# Patient Record
Sex: Male | Born: 1960 | Race: White | Hispanic: No | Marital: Single | Smoking: Never smoker
Health system: Southern US, Community
[De-identification: ages and names within clinical notes are randomized; demographics above are authoritative.]

## PROBLEM LIST (undated history)

## (undated) DIAGNOSIS — I719 Aortic aneurysm of unspecified site, without rupture: Secondary | ICD-10-CM

## (undated) DIAGNOSIS — Z953 Presence of xenogenic heart valve: Secondary | ICD-10-CM

## (undated) DIAGNOSIS — I10 Essential (primary) hypertension: Secondary | ICD-10-CM

## (undated) HISTORY — PX: OTHER SURGICAL HISTORY: SHX169

## (undated) HISTORY — PX: APPENDECTOMY: SHX54

## (undated) SURGERY — ECHOCARDIOGRAM, TRANSESOPHAGEAL
Anesthesia: General

---
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
## 2005-09-11 ENCOUNTER — Ambulatory Visit: Payer: Self-pay | Admitting: Cardiology

## 2006-02-11 ENCOUNTER — Ambulatory Visit: Payer: Self-pay | Admitting: Cardiology

## 2006-05-13 ENCOUNTER — Encounter: Payer: Self-pay | Admitting: Cardiology

## 2006-05-24 ENCOUNTER — Encounter: Payer: Self-pay | Admitting: Cardiology

## 2006-06-24 ENCOUNTER — Encounter: Payer: Self-pay | Admitting: Cardiology

## 2006-07-25 ENCOUNTER — Encounter: Payer: Self-pay | Admitting: Cardiology

## 2006-08-24 ENCOUNTER — Encounter: Payer: Self-pay | Admitting: Cardiology

## 2006-09-24 ENCOUNTER — Encounter: Payer: Self-pay | Admitting: Cardiology

## 2008-06-19 ENCOUNTER — Ambulatory Visit: Payer: Self-pay | Admitting: Unknown Physician Specialty

## 2013-11-04 DIAGNOSIS — Z8774 Personal history of (corrected) congenital malformations of heart and circulatory system: Secondary | ICD-10-CM

## 2013-11-04 DIAGNOSIS — Z953 Presence of xenogenic heart valve: Secondary | ICD-10-CM

## 2013-11-04 HISTORY — DX: Personal history of (corrected) congenital malformations of heart and circulatory system: Z87.74

## 2013-11-04 HISTORY — DX: Presence of xenogenic heart valve: Z95.3

## 2014-07-12 DIAGNOSIS — I351 Nonrheumatic aortic (valve) insufficiency: Secondary | ICD-10-CM | POA: Insufficient documentation

## 2014-07-12 HISTORY — DX: Nonrheumatic aortic (valve) insufficiency: I35.1

## 2017-05-15 DIAGNOSIS — R002 Palpitations: Secondary | ICD-10-CM | POA: Insufficient documentation

## 2017-05-15 HISTORY — DX: Palpitations: R00.2

## 2017-05-28 DIAGNOSIS — I493 Ventricular premature depolarization: Secondary | ICD-10-CM | POA: Insufficient documentation

## 2017-05-28 HISTORY — DX: Ventricular premature depolarization: I49.3

## 2018-01-18 ENCOUNTER — Encounter: Payer: Self-pay | Admitting: Emergency Medicine

## 2018-01-18 ENCOUNTER — Ambulatory Visit
Admission: EM | Admit: 2018-01-18 | Discharge: 2018-01-18 | Disposition: A | Discharge status: Home / Self Care | Payer: Managed Care, Other (non HMO) | Attending: Family Medicine | Admitting: Family Medicine

## 2018-01-18 ENCOUNTER — Other Ambulatory Visit: Payer: Self-pay

## 2018-01-18 DIAGNOSIS — R05 Cough: Secondary | ICD-10-CM | POA: Diagnosis not present

## 2018-01-18 DIAGNOSIS — J111 Influenza due to unidentified influenza virus with other respiratory manifestations: Secondary | ICD-10-CM

## 2018-01-18 DIAGNOSIS — R69 Illness, unspecified: Secondary | ICD-10-CM | POA: Diagnosis not present

## 2018-01-18 DIAGNOSIS — R509 Fever, unspecified: Secondary | ICD-10-CM | POA: Diagnosis not present

## 2018-01-18 DIAGNOSIS — M791 Myalgia, unspecified site: Secondary | ICD-10-CM | POA: Diagnosis not present

## 2018-01-18 HISTORY — DX: Aortic aneurysm of unspecified site, without rupture: I71.9

## 2018-01-18 LAB — RAPID INFLUENZA A&B ANTIGENS: Influenza B (ARMC): NEGATIVE

## 2018-01-18 LAB — RAPID INFLUENZA A&B ANTIGENS (ARMC ONLY): INFLUENZA A (ARMC): POSITIVE — AB

## 2018-01-18 MED ORDER — OSELTAMIVIR PHOSPHATE 75 MG PO CAPS: 75.0000 mg | ORAL_CAPSULE | Freq: Two times a day (BID) | ORAL | 0 refills | Status: DC

## 2018-01-18 MED ORDER — HYDROCOD POLST-CPM POLST ER 10-8 MG/5ML PO SUER: 5.0000 mL | Freq: Every evening | ORAL | 0 refills | Status: DC | PRN

## 2018-01-18 NOTE — ED Triage Notes (Signed)
Patient states he developed fever, headache and cough over the weekend and is progressively feeling worse.

## 2018-01-18 NOTE — Discharge Instructions (Signed)
Take medication as prescribed. Rest. Drink plenty of fluids.  ° °Follow up with your primary care physician this week as needed. Return to Urgent care for new or worsening concerns.  ° °

## 2018-01-18 NOTE — ED Provider Notes (Signed)
[facility] ____________________________________________  Time seen: Approximately 11:50 AM  I have reviewed the triage vital signs and the nursing notes.   HISTORY  Chief Complaint Influenza   HPI Antonio [name] is a 57 y.o. male presented for evaluation of runny nose, nasal cough, chills, body aches that started suddenly Saturday morning.  Patient reports some recent sick contacts with similar.  States temperature when he measured was around 100.  Has been taken over-the-counter Tylenol as well as Tylenol cold.  States these medicines do briefly help but symptoms return.  States last night cough interrupted sleep.  Denies associated chest pain or shortness of breath.  Denies sore throat.  Overall continues to eat and drink well.  Did receive flu immunization. Denies chest pain, shortness of breath, abdominal pain,or rash. Denies recent sickness. Denies recent antibiotic use.    Past Medical History:  Diagnosis Date  . Aortic aneurysm (HCC)   aortic valve insufficiency and replacement  There are no active problems to display for this patient.   Past Surgical History:  Procedure Laterality Date  . valve replacement       No current facility-administered medications for this encounter.   Current Outpatient Medications:  .  aspirin 81 MG chewable tablet, Chew by mouth daily., Disp: , Rfl:  .  atorvastatin (LIPITOR) 10 MG tablet, Take 10 mg by mouth daily., Disp: , Rfl:  .  metoprolol succinate (TOPROL-XL) 50 MG 24 hr tablet, Take 50 mg by mouth daily. Take with or immediately following a meal., Disp: , Rfl:  .  chlorpheniramine-HYDROcodone (TUSSIONEX PENNKINETIC ER) 10-8 MG/5ML SUER, Take 5 mLs by mouth at bedtime as needed for cough. do not drive or operate machinery while taking as can cause drowsiness., Disp: 75 mL, Rfl: 0 .  oseltamivir (TAMIFLU) 75 MG capsule, Take 1 capsule (75 mg total) by mouth every 12 (twelve) hours., Disp: 10 capsule, Rfl:  0  Allergies Patient has no known allergies.  Family History  Problem Relation Age of Onset  . Hypertension Mother     Social History Social History   Tobacco Use  . Smoking status: Never Smoker  . Smokeless tobacco: Never Used  Substance Use Topics  . Alcohol use: No    Frequency: Never  . Drug use: No    Review of Systems Constitutional: as above Eyes: No visual changes. ENT: No sore throat. Cardiovascular: Denies chest pain. Respiratory: Denies shortness of breath. Gastrointestinal: No abdominal pain.  No nausea, no vomiting.   Musculoskeletal: Negative for back pain. Skin: Negative for rash.   ____________________________________________   PHYSICAL EXAM:  VITAL SIGNS: ED Triage Vitals  Enc Vitals Group     BP 01/18/18 1125 119/87     Pulse Rate 01/18/18 1125 77     Resp 01/18/18 1125 18     Temp 01/18/18 1125 98.2 F (36.8 C)     Temp src --      SpO2 01/18/18 1125 99 %     Weight 01/18/18 1120 198 lb (89.8 kg)     Height 01/18/18 1120 5\' 9"  (1.753 m)     Head Circumference --      Peak Flow --      Pain Score 01/18/18 1120 3     Pain Loc --      Pain Edu? --      Excl. in [place]? --     Constitutional: Alert and oriented. Well appearing and in no acute distress. Eyes: Conjunctivae are normal.  Head:  Atraumatic. No sinus tenderness to palpation. No swelling. No erythema.  Ears: no erythema, normal TMs bilaterally.   Nose:Nasal congestion with clear rhinorrhea  Mouth/Throat: Mucous membranes are moist. No pharyngeal erythema. No tonsillar swelling or exudate.  Neck: No stridor.  No cervical spine tenderness to palpation. Hematological/Lymphatic/Immunilogical: No cervical lymphadenopathy. Cardiovascular: Normal rate, regular rhythm.Good peripheral circulation. Respiratory: Normal respiratory effort.  No retractions. No wheezes, rales or rhonchi. Good air movement.  Musculoskeletal: Ambulatory with steady gait. No cervical, thoracic or lumbar  tenderness to palpation. Neurologic:  Normal speech and language. No gait instability. Skin:  Skin appears warm, dry and intact. No rash noted. Psychiatric: Mood and affect are normal. Speech and behavior are normal.  ___________________________________________   LABS (all labs ordered are listed, but only abnormal results are displayed)  Labs Reviewed  RAPID INFLUENZA A&B ANTIGENS ([facility] ONLY) - Abnormal; Notable for the following components:      Result Value   Influenza A ([facility]) POSITIVE (*)    All other components within normal limits    PROCEDURES Procedures    INITIAL IMPRESSION / ASSESSMENT AND PLAN / ED COURSE  Pertinent labs & imaging results that were available during my care of the patient were reviewed by me and considered in my medical decision making (see chart for details).  Well-appearing patient.  No acute distress.  Suspect influenza.  Discussed treatment, will treat with Tamiflu and as needed Tussionex.  Encourage rest, fluids, supportive care.Discussed indication, risks and benefits of medications with patient.  Discussed follow up with Primary care physician this week. Discussed follow up and return parameters including no resolution or any worsening concerns. Patient verbalized understanding and agreed to plan.   ____________________________________________   FINAL CLINICAL IMPRESSION(S) / ED DIAGNOSES  Final diagnoses:  Influenza-like illness     ED Discharge Orders        Ordered    oseltamivir (TAMIFLU) 75 MG capsule  Every 12 hours     01/18/18 1149    chlorpheniramine-HYDROcodone (TUSSIONEX PENNKINETIC ER) 10-8 MG/5ML SUER  At bedtime PRN     01/18/18 1149       Note: This dictation was prepared with Dragon dictation along with smaller phrase technology. Any transcriptional errors that result from this process are unintentional.         Marylene Land, NP 01/18/18 1255

## 2018-06-11 DIAGNOSIS — G4733 Obstructive sleep apnea (adult) (pediatric): Secondary | ICD-10-CM

## 2018-06-11 HISTORY — DX: Obstructive sleep apnea (adult) (pediatric): G47.33

## 2019-05-26 ENCOUNTER — Encounter: Payer: Self-pay | Admitting: Urology

## 2019-05-26 ENCOUNTER — Other Ambulatory Visit: Payer: Self-pay

## 2019-05-26 ENCOUNTER — Ambulatory Visit: Payer: Managed Care, Other (non HMO) | Admitting: Urology

## 2019-05-26 VITALS — BP 118/75 | HR 62 | Ht 69.0 in | Wt 199.0 lb

## 2019-05-26 DIAGNOSIS — E8881 Metabolic syndrome: Secondary | ICD-10-CM

## 2019-05-26 DIAGNOSIS — R972 Elevated prostate specific antigen [PSA]: Secondary | ICD-10-CM | POA: Diagnosis not present

## 2019-05-26 HISTORY — DX: Metabolic syndrome: E88.81

## 2019-05-26 HISTORY — DX: Metabolic syndrome: E88.810

## 2019-05-26 NOTE — Progress Notes (Signed)
05/26/2019 9:19 AM   Antonio [name] 1961/04/11 [id number]  Referring provider: Isaias Cowman, MD [facility] [place],   [id number]  CC: Elevated PSA  HPI: I was asked to see Mr. Antonio [name] in urology clinic today in consultation for elevated PSA from Dr. Saralyn [name].  He is a healthy 58 year old male with a history of a aortic valve replacement at [facility](on aspirin) who presents with an elevated PSA of 5.4 with 9% free.  He thinks his prior PSA value in 2017 was about 4.1.  He has never undergone prostate biopsy previously.  He denies any family history of prostate or breast cancer.  He has very mild urinary symptoms of weak stream and straining with nocturia 1-2 times per night.  He is minimally bothered by his symptoms.  There are no aggravating or alleviating factors.  Severity is mild.  He denies any weight loss or bone pain.  He works in [employer].  He is a non-smoker.  His past surgical history is notable for open appendectomy.   PMH: Past Medical History:  Diagnosis Date  . Aortic aneurysm ([place])   . Aortic insufficiency 07/12/2014   Overview:  Mild to moderate  . H/O bicuspid aortic valve 11/04/2013  . Heart palpitations 05/15/2017  . Metabolic syndrome 2/0/2542  . OSA (obstructive sleep apnea) 06/11/2018  . Premature ventricular contractions 05/28/2017  . S/P aortic valve replacement with bioprosthetic valve 11/04/2013   Overview:  With carpentier-edwards bovine pericardial valve and aortic aneurysm repair 03/26/06 by Dr Ysidro E[facility]    Surgical History: Past Surgical History:  Procedure Laterality Date  . valve replacement      Allergies: No Known Allergies  Family History: Family History  Problem Relation Age of Onset  . Hypertension Mother     Social History:  reports that he has never smoked. He has never used smokeless tobacco. He reports that he does not drink alcohol or use drugs.  ROS: Please see flowsheet from  today's date for complete review of systems.  Physical Exam: BP 118/75   Pulse 62   Ht 5\' 9"  (1.753 m)   Wt 199 lb (90.3 kg)   BMI 29.39 kg/m    Constitutional:  Alert and oriented, No acute distress. Cardiovascular: No clubbing, cyanosis, or edema. Respiratory: Normal respiratory effort, no increased work of breathing. GI: Abdomen is soft, nontender, nondistended, no abdominal masses DRE: 25 g, smooth, no nodules or masses Lymph: No cervical or inguinal lymphadenopathy. Skin: No rashes, bruises or suspicious lesions. Neurologic: Grossly intact, no focal deficits, moving all 4 extremities. Psychiatric: Normal mood and affect.  Laboratory Data: PSA May 2020: 5.4, 9% free  Pertinent Imaging: None to review  Assessment & Plan:   In summary, the patient is a healthy 58 year old male with no family history of prostate cancer, mildly elevated PSA of 5.4 with 9% free, and normal DRE.  We reviewed the implications of an elevated PSA and the uncertainty surrounding it. In general, a man's PSA increases with age and is produced by both normal and cancerous prostate tissue. The differential diagnosis for elevated PSA includes BPH, prostate cancer, infection, recent intercourse/ejaculation, recent urethroscopic manipulation (foley placement/cystoscopy) or trauma, and prostatitis.   Management of an elevated PSA can include observation or prostate biopsy and we discussed this in detail. Our goal is to detect clinically significant prostate cancers, and manage with either active surveillance, surgery, or radiation for localized disease. Risks of prostate biopsy include bleeding, infection (including life  threatening sepsis), pain, and lower urinary symptoms. Hematuria, hematospermia, and blood in the stool are all common after biopsy and can persist up to 4 weeks.   Repeat PSA with free/total ratio today, with results Pursue prostate biopsy if PSA remains elevated  Billey Co, MD   [facility] [address] [place], [place] [address] [phone]

## 2019-05-26 NOTE — Patient Instructions (Signed)
Transrectal Ultrasound-Guided Prostate Biopsy This is a procedure to take samples of tissue from your prostate. Ultrasound images are used to guide the procedure. It is usually done to check for prostate cancer. What happens before the procedure? Staying hydrated Follow instructions about liquids. These may include:  Up to 2 hours before the procedure - you may drink clear liquids. This includes water, clear fruit juice, black coffee, and plain tea.  Eating and drinking restrictions Follow instructions about eating and drinking. These may include:  8 hours before the procedure - stop eating heavy meals or foods. This includes meat, fried foods, or fatty foods.  6 hours before the procedure - stop eating light meals or foods. This includes toast or cereal.  6 hours before the procedure - stop drinking milk or drinks that have milk.  2 hours before the procedure - stop drinking clear liquids. Medicines Ask your doctor about:  Changing or stopping your normal medicines. This is important if you take diabetes medicines or blood thinners.  Taking over-the-counter medicines, vitamins, herbs, and supplements.  Taking medicines such as aspirin and ibuprofen. These medicines can thin your blood. Do not take these medicines unless your doctor tells you to take them. General instructions  You may be given antibiotic medicine. If so, take it as told by your doctor.  Liquid will be used to clear waste from your butt (enema).  You may have a blood sample taken.  You may have a pee (urine) sample taken.  Plan to have someone take you home after your procedure. What happens during the procedure?   To lower your risk of infection: ? Your health care team will wash or sanitize their hands. ? Hair may be removed from the area. ? Your skin will be cleaned with soap. ? You will be given antibiotics.  An IV will be placed into one of your veins.  You will be given one or both of these: ? A  medicine to help you relax. ? A medicine to numb the area.  You will lie on your left side. Your knees will be bent.  A probe with gel on it will be placed in your butt. Pictures will be taken of your prostate and the area around it.  Medicine will be used to numb your prostate.  A needle will be placed in your butt and moved to your prostate.  Prostate tissue will be removed.  The samples will be sent to a lab. The procedure may vary. What happens after the procedure?  You will be watched until the medicines you were given have worn off.  You may have some pain in your butt. You will be given medicine for it. Summary  This procedure is usually done to check for prostate cancer.  Before the procedure, ask your doctor about changing or stopping your medicines.  You may have some pain in your butt. You will be given medicine for it.  Plan to have someone take you home after the procedure. This information is not intended to replace advice given to you by your health care provider. Make sure you discuss any questions you have with your health care provider. Document Released: 10/29/2009 Document Revised: 03/02/2019 Document Reviewed: 02/06/2017 Elsevier Patient Education  [place]. Prostate Cancer Screening  The prostate is a walnut-sized gland that is located below the bladder and in front of the rectum in males. The function of the prostate (prostate gland) is to add fluid to semen during ejaculation.  Prostate cancer is the second most common type of cancer in men. A screening test for cancer is a test that is done before cancer symptoms start. Screening can help to identify cancer at an early stage, when the cancer can be treated more easily. The recommended prostate cancer screening test is a blood test called the prostate-specific antigen (PSA) test. PSA is a protein that is made in the prostate. As you age, your prostate naturally produces more PSA. Abnormally high PSA  levels may be caused by:  Prostate cancer.  An enlarged prostate that is not caused by cancer (benign prostatic hyperplasia, BPH). This condition is very common in older men.  A prostate gland infection (prostatitis).  Medicines to assist with hair growth, such as finasteride. Depending on the PSA results, you may need more tests, such as:  A physical exam to check the size of your prostate gland.  Blood and imaging tests.  A procedure to remove tissue samples from your prostate gland for testing (biopsy). Who should have screening? Screening recommendations vary based on age.  If you are younger than age 47, screening is not recommended.  If you are age 17-54 and you have no risk factors, screening is not recommended.  If you are younger than age 55, ask your health care provider if you need screening if you have one of these risk factors: ? Being of African-American descent. ? Having a family history of prostate cancer.  If you are age 68-69, talk with your health care provider about your need for screening and how often screening should be done.  If you are older than age 70, screening is not recommended. This is because the risks that screening can cause are greater than the benefits that it may provide (risks outweigh the benefits). If you are at high risk for prostate cancer, your health care provider may recommend that you have screenings more often or start screening at a younger age. You may be at high risk if you:  Are older than age 72.  Are African-American.  Have a father, brother, or uncle who has been diagnosed with prostate cancer. The risk may be higher if your family member's cancer occurred at an early age. What are the benefits of screening? There is a small chance that screening may lower your risk of dying from prostate cancer. The chance is small because prostate cancer is typically a slow-growing cancer, and most men with prostate cancer die from a  different cause. What are the risks of screening? The main risk of prostate cancer screening is diagnosing and treating prostate cancer that would never have caused any symptoms or problems (overdiagnosis and overtreatment). PSA screening cannot tell you if your PSA is high due to cancer or a different cause. A prostate biopsy is the only procedure to diagnose prostate cancer. Even the results of a biopsy may not tell you if your cancer needs to be treated. Slow-growing prostate cancer may not need any treatment other than monitoring, so diagnosing and treating it may cause unnecessary stress or other side effects. A prostate biopsy may also cause:  Infection or fever.  A false negative. This is a result that shows that you do not have prostate cancer when you actually do have prostate cancer. Questions to ask your health care provider  When should I start prostate cancer screening?  What is my risk for prostate cancer?  How often do I need screening?  What type of screening tests do I need?  How do I get my test results?  What do my results mean?  Do I need treatment? Contact a health care provider if:  You have difficulty urinating.  You have pain when you urinate or ejaculate.  You have blood in your urine or semen.  You have pain in your back or in the area of your prostate.  You have trouble getting or maintaining an erection (erectile dysfunction, ED). Summary  Prostate cancer is a common type of cancer in men. The prostate (prostate gland) is located below the bladder and in front of the rectum. This gland adds fluid to semen during ejaculation.  Prostate cancer screening may identify cancer at an early stage, when the cancer can be treated more easily.  The prostate-specific antigen (PSA) test is the recommended screening test for prostate cancer.  Discuss the risks and benefits of prostate cancer screening with your health care provider. If you are age 76 or older,  screening is likely to lead to more risks than benefits (risks outweigh the benefits). This information is not intended to replace advice given to you by your health care provider. Make sure you discuss any questions you have with your health care provider. Document Released: 08/21/2017 Document Revised: 10/23/2017 Document Reviewed: 08/21/2017 Elsevier Patient Education  2020 [employer].

## 2019-05-27 LAB — PSA, TOTAL AND FREE
PSA, Free Pct: 9.8 %
PSA, Free: 0.64 ng/mL
Prostate Specific Ag, Serum: 6.5 ng/mL — ABNORMAL HIGH (ref 0.0–4.0)

## 2019-05-30 ENCOUNTER — Telehealth: Payer: Self-pay

## 2019-05-30 NOTE — Telephone Encounter (Signed)
-----   Message from Billey Co, MD sent at 05/30/2019  3:54 PM EDT ----- PSA remains elevated at 6.5. Please review prostate biopsy instructions and schedule prostate biopsy next available.  Thanks Nickolas Madrid, MD 05/30/2019

## 2019-05-30 NOTE — Telephone Encounter (Signed)
Patient notified , he is taking asa and will need clearance from Dr. Josefa Half before stopping, will fax clearance. Patient states that he has a heart valve and requires additional antibiotics prior to procedures and would like these sent into his pharmacy Please advise  thanks

## 2019-05-31 NOTE — Telephone Encounter (Signed)
Will await clearance from [facility].  Re: antibiotics, the guidelines from American college of cardiology and american heart association, as well as the [place] urologic association no longer recommends additional abx for patients with high risk cardiac conditions(i.e valve replacement) for prevention of infectious endocarditis. We give cipro and gentamycin pre-procedure in office which will cover his abx need.  Please refer him to auanet.org or American heart association website if he has other questions  Thanks, Nickolas Madrid, MD 05/31/2019

## 2019-06-02 NOTE — Telephone Encounter (Signed)
Clearance was faxed

## 2019-06-06 NOTE — Telephone Encounter (Signed)
Clearance was received patient ok to have prostate biopsy, please contact patient to let him know and schedule  thanks

## 2019-06-08 ENCOUNTER — Telehealth: Payer: Self-pay

## 2019-06-08 NOTE — Telephone Encounter (Signed)
Pt calls back, made appointment for bx. Scheduled pt 07/14/2019. Gave pt verbal instructions on bx prep. Pt gave verbal understanding.

## 2019-06-08 NOTE — Telephone Encounter (Signed)
Called pt, informed him of cardiac clearance. Before I was able to schedule pt he needed to take another call, he will call back to be scheduled. Informed pt of the need to d/c aspirin I was unable to go over any further instructions.

## 2019-07-14 ENCOUNTER — Ambulatory Visit: Payer: Managed Care, Other (non HMO) | Admitting: Urology

## 2019-07-14 ENCOUNTER — Other Ambulatory Visit: Payer: Self-pay

## 2019-07-14 ENCOUNTER — Encounter: Payer: Self-pay | Admitting: Urology

## 2019-07-14 ENCOUNTER — Other Ambulatory Visit: Payer: Self-pay | Admitting: Urology

## 2019-07-14 VITALS — BP 118/77 | HR 60 | Ht 69.0 in | Wt 197.0 lb

## 2019-07-14 DIAGNOSIS — R972 Elevated prostate specific antigen [PSA]: Secondary | ICD-10-CM

## 2019-07-14 MED ORDER — GENTAMICIN SULFATE 40 MG/ML IJ SOLN
80.0000 mg | Freq: Once | INTRAMUSCULAR | Status: AC
  Administered 2019-07-14: 80 mg via INTRAMUSCULAR

## 2019-07-14 MED ORDER — LEVOFLOXACIN 500 MG PO TABS
500.0000 mg | ORAL_TABLET | Freq: Once | ORAL | Status: AC
  Administered 2019-07-14: 09:00:00 500 mg via ORAL

## 2019-07-14 NOTE — Progress Notes (Signed)
   07/14/19  Indication: Elevated PSA, 6.5  Prostate Biopsy Procedure   Informed consent was obtained, and we discussed the risks of bleeding and infection/sepsis. A time out was performed to ensure correct patient identity.  Pre-Procedure: - Last PSA Level: 6.5, 9.8% - Gentamicin and levaquin given for antibiotic prophylaxis - Transrectal Ultrasound performed revealing a 29 gm prostate, PSA density 0.22 - Small median lobe, mildly dilated/abnormal left SV, subtle hypoechoic area at left side of gland  Procedure: - Prostate block performed using 10 cc 1% lidocaine and biopsies taken from sextant areas, a total of 12 under ultrasound guidance.  Post-Procedure: - Patient tolerated the procedure well - He was counseled to seek immediate medical attention if experiences significant bleeding, fevers, or severe pain - Return in one week to discuss biopsy results  Assessment/ Plan: Will follow up in 1-2 weeks to discuss pathology  Nickolas Madrid, MD 07/14/2019

## 2019-07-21 LAB — ANATOMIC PATHOLOGY REPORT: PDF Image: 0

## 2019-08-04 ENCOUNTER — Encounter: Payer: Self-pay | Admitting: Urology

## 2019-08-04 ENCOUNTER — Other Ambulatory Visit: Payer: Self-pay | Admitting: Urology

## 2019-08-04 ENCOUNTER — Other Ambulatory Visit: Payer: Self-pay

## 2019-08-04 ENCOUNTER — Ambulatory Visit (INDEPENDENT_AMBULATORY_CARE_PROVIDER_SITE_OTHER): Payer: Managed Care, Other (non HMO) | Admitting: Urology

## 2019-08-04 VITALS — BP 136/76 | HR 59 | Ht 69.0 in | Wt 199.4 lb

## 2019-08-04 DIAGNOSIS — C61 Malignant neoplasm of prostate: Secondary | ICD-10-CM | POA: Diagnosis not present

## 2019-08-04 NOTE — Progress Notes (Signed)
   08/04/2019 12:52 PM   Antonio [name] [birth year] [id number]  Reason for visit: Discuss prostate biopsy results  HPI: I saw Antonio [name] in follow-up to discuss his prostate biopsy results.  Briefly, he is a healthy 58 year old male with a history of aortic valve replacement on aspirin and appendectomy for ruptured appendix who underwent prostate biopsy on 07/14/2019 for an elevated PSA of 6.5.  This showed a 29 g prostate with a PSA density of 0.22, and 5/12 cores positive for prostate cancer.  This was primarily Gleason score 4+3 = 7, with 80% max core involvement.  He denies erectile dysfunction or any significant urinary symptoms at baseline.  We had a lengthy conversation today about the patient's new diagnosis of prostate cancer.  We reviewed the risk classifications per the AUA guidelines including very low risk, low risk, intermediate risk, and high risk disease, and the need for additional staging imaging with CT and bone scan in patients with unfavorable intermediate risk and high risk disease.  I explained that his life expectancy, clinical stage, Gleason score, PSA, and other [name]-morbidities influence treatment strategies.  We discussed the roles of active surveillance, radiation therapy, surgical therapy with robotic prostatectomy, and hormone therapy with androgen deprivation.  We discussed that patients urinary symptoms also impact treatment strategy, as patients with severe lower urinary tract symptoms may have significant worsening or even develop urinary retention after undergoing radiation.  In regards to surgery, we discussed robotic prostatectomy +/- lymphadenectomy at length.  The procedure takes 3 to 4 hours, and patient's typically discharge home on post-op day #1.  A Foley catheter is left in place for 7 to 10 days to allow for healing of the vesicourethral anastomosis.  There is a small risk of bleeding, infection, damage to surrounding structures or bowel, hernia, DVT/PE, or  serious cardiac or pulmonary complications.  We discussed at length post-op side effects including erectile dysfunction, and the importance of pre-operative erectile function on long-term outcomes.  Even with a nerve sparing approach, there is an approximately 25% rate of permanent erectile dysfunction.  We also discussed postop urinary incontinence at length.  We expect patients to have stress incontinence post-operatively that will improve over period of weeks to months.  Less than 10% of men will require a pad at 1 year after surgery.  Patients will need to avoid heavy lifting and strenuous activity for 3 to 4 weeks, but most men return to their baseline activity status by 6 weeks.  In summary, Antonio [name] is a 58 y.o. 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  He is considering his options between radiation with ADT and surgery, and will obtain bone scan and CT abdomen pelvis.  -CT abdomen pelvis and bone scan ordered to complete staging, will call with results -Referral placed to radiation oncology to discuss radiation -Patient will call with how he would like to proceed  A total of 25 minutes were spent face-to-face with the patient, greater than 50% was spent in patient education, counseling, and coordination of care regarding new diagnosis of unfavorable intermediate risk prostate cancer and treatment options.  Antonio [name], [facility] [address], [place], [place] [address] [phone]

## 2019-08-10 ENCOUNTER — Other Ambulatory Visit: Payer: Self-pay

## 2019-08-10 ENCOUNTER — Other Ambulatory Visit: Payer: Self-pay | Admitting: *Deleted

## 2019-08-11 ENCOUNTER — Encounter: Payer: Self-pay | Admitting: Radiation Oncology

## 2019-08-11 ENCOUNTER — Ambulatory Visit
Admission: RE | Admit: 2019-08-11 | Discharge: 2019-08-11 | Disposition: A | Discharge status: Home / Self Care | Payer: Managed Care, Other (non HMO) | Source: Ambulatory Visit | Attending: Radiation Oncology | Admitting: Radiation Oncology

## 2019-08-11 ENCOUNTER — Other Ambulatory Visit: Payer: Self-pay

## 2019-08-11 VITALS — BP 127/67 | HR 56 | Temp 97.4°F | Resp 16 | Wt 200.6 lb

## 2019-08-11 DIAGNOSIS — C61 Malignant neoplasm of prostate: Secondary | ICD-10-CM | POA: Diagnosis not present

## 2019-08-11 DIAGNOSIS — I7409 Other arterial embolism and thrombosis of abdominal aorta: Secondary | ICD-10-CM | POA: Diagnosis not present

## 2019-08-11 DIAGNOSIS — I714 Abdominal aortic aneurysm, without rupture: Secondary | ICD-10-CM | POA: Insufficient documentation

## 2019-08-11 DIAGNOSIS — Z79899 Other long term (current) drug therapy: Secondary | ICD-10-CM | POA: Diagnosis not present

## 2019-08-11 DIAGNOSIS — E8881 Metabolic syndrome: Secondary | ICD-10-CM | POA: Diagnosis not present

## 2019-08-11 DIAGNOSIS — G4730 Sleep apnea, unspecified: Secondary | ICD-10-CM | POA: Diagnosis not present

## 2019-08-11 DIAGNOSIS — R002 Palpitations: Secondary | ICD-10-CM | POA: Insufficient documentation

## 2019-08-11 NOTE — Consult Note (Signed)
NEW PATIENT EVALUATION  Name: Antonio [name]  MRN: [id number]  Date:   08/11/2019     DOB: 06-Jan-1961   This 58 y.o. 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  REFERRING PHYSICIAN: Billey Co, MD  CHIEF COMPLAINT:  Chief Complaint  Patient presents with  . Prostate Cancer    Initial consultation    DIAGNOSIS: The encounter diagnosis was Prostate cancer ([place]).   PREVIOUS INVESTIGATIONS:  Pathology report reviewed Clinical notes reviewed Bone scan and CT scan abdomen pelvis scheduled for next week  HPI: Patient is a 58 year old male who presented with an elevated PSA of 6.5.  He was seen by urology found to have a 29 cc prostate.  Patient does have a history of aortic valve replacement is currently on aspirin.  He is also history of appendectomy for ruptured appendix in the past.  Underwent transrectal ultrasound-guided biopsy showing 5 of 12 cores positive for adenocarcinoma.  Most were Gleason 7 (3+4) although one core was Gleason 7 (4+3).  Patient is fairly asymptomatic.  He is not specifically denies any increased lower urinary tract symptoms urgency frequency or nocturia.  He also denies erectile dysfunction.  Treatment options have been explained by urology as far as robotic assisted prostatectomy.  He is seen today for radiation oncology opinion.  Bone scan and CT scan of abdomen pelvis have been ordered and are pending  PLANNED TREATMENT REGIMEN: Undecided  PAST MEDICAL HISTORY:  has a past medical history of Aortic aneurysm ([place]), Aortic insufficiency (07/12/2014), H/O bicuspid aortic valve (11/04/2013), Heart palpitations (99991111), Metabolic syndrome (99991111), OSA (obstructive sleep apnea) (06/11/2018), Premature ventricular contractions (05/28/2017), and S/P aortic valve replacement with bioprosthetic valve (11/04/2013).    PAST SURGICAL HISTORY:   Past Surgical History:  Procedure Laterality Date  . valve replacement      FAMILY HISTORY: family history includes Hypertension in his mother.  SOCIAL HISTORY:  reports that he has never smoked. He has never used smokeless tobacco. He reports that he does not drink alcohol or use drugs.  ALLERGIES: Patient has no known allergies.  MEDICATIONS:  Current Outpatient Medications  Medication Sig Dispense Refill  . aspirin 81 MG chewable tablet Chew by mouth daily.    Marland Kitchen atorvastatin (LIPITOR) 10 MG tablet Take 10 mg by mouth daily.    . metoprolol succinate (TOPROL-XL) 50 MG 24 hr tablet Take 50 mg by mouth daily. Take with or immediately following a meal.     No current facility-administered medications for this encounter.     ECOG PERFORMANCE STATUS:  0 - Asymptomatic  REVIEW OF SYSTEMS: Patient denies any weight loss, fatigue, weakness, fever, chills or night sweats. Patient denies any loss of vision, blurred vision. Patient denies any ringing  of the ears or hearing loss. No irregular heartbeat. Patient denies heart murmur or history of fainting. Patient denies any chest pain or pain radiating to her upper extremities. Patient denies any shortness of breath, difficulty breathing at night, cough or hemoptysis. Patient denies any swelling in the lower legs. Patient denies any nausea vomiting, vomiting of blood, or coffee ground material in the vomitus. Patient denies any stomach pain. Patient states has had normal bowel movements no significant constipation or diarrhea. Patient denies any dysuria, hematuria or significant nocturia. Patient denies any problems walking, swelling in the joints or loss of balance. Patient denies any skin changes, loss  of hair or loss of weight. Patient denies any excessive worrying or anxiety or significant depression. Patient denies any problems with insomnia. Patient denies excessive thirst, polyuria, polydipsia. Patient denies any swollen glands, patient denies  easy bruising or easy bleeding. Patient denies any recent infections, allergies or URI. Patient "s visual fields have not changed significantly in recent time.   PHYSICAL EXAM: BP 127/67 (BP Location: Left Arm, Patient Position: Sitting)   Pulse (!) 56   Temp (!) 97.4 F (36.3 C) (Tympanic)   Resp 16   Wt 200 lb 9.6 oz (91 kg)   BMI 29.62 kg/m  On rectal exam rectal sphincter tone is good prostate is smooth without evidence of nodularity or mass sulcus is preserved bilaterally.  No other rectal abnormalities identified.  Well-developed well-nourished patient in NAD. HEENT reveals PERLA, EOMI, discs not visualized.  Oral cavity is clear. No oral mucosal lesions are identified. Neck is clear without evidence of cervical or supraclavicular adenopathy. Lungs are clear to A&P. Cardiac examination is essentially unremarkable with regular rate and rhythm without murmur rub or thrill. Abdomen is benign with no organomegaly or masses noted. Motor sensory and DTR levels are equal and symmetric in the upper and lower extremities. Cranial nerves II through XII are grossly intact. Proprioception is intact. No peripheral adenopathy or edema is identified. No motor or sensory levels are noted. Crude visual fields are within normal range.  LABORATORY DATA: Pathology report reviewed    RADIOLOGY RESULTS: Bone scan and CT scan of abdomen pelvis have been ordered and will be reviewed when available   IMPRESSION: Stage IIb mostly Gleason 7 (3+4) adenocarcinoma the prostate in 58 year old male  PLAN: At this time I run the [facility] nomogram showing a proximal 64% of organ confined disease with 34% chance of extracapsular extension there is a 4% chance of lymph node involvement and 3% chance of seminal vesicle involvement.  Patient will be a candidate for robotic assisted prostatectomy.  As far as radiation is concerned I would favor I-125 interstitial implant based on my ability to deliver extremely  high dose of radiation to his prostatic volume as well as treating possible extracapsular extension.  Risks and benefits of both external beam as well as I-125 interstitial implant were reviewed with the patient.  Side effects such as possible erectile dysfunction fatigue increased lower urinary tract symptoms diarrhea all were discussed in detail.  I have set up a follow-up appointment in 2 weeks after I can review his bone scan and CT scan of abdomen pelvis for complete staging.  Patient knows to call with any concerns at any time.  I would like to take this opportunity to thank you for allowing me to participate in the care of your patient.Noreene Filbert, MD

## 2019-08-26 ENCOUNTER — Other Ambulatory Visit: Payer: Self-pay

## 2019-08-26 ENCOUNTER — Encounter
Admission: RE | Admit: 2019-08-26 | Discharge: 2019-08-26 | Disposition: A | Discharge status: Home / Self Care | Payer: Managed Care, Other (non HMO) | Source: Ambulatory Visit | Attending: Urology | Admitting: Urology

## 2019-08-26 ENCOUNTER — Ambulatory Visit
Admission: RE | Admit: 2019-08-26 | Discharge: 2019-08-26 | Disposition: A | Discharge status: Home / Self Care | Payer: Managed Care, Other (non HMO) | Source: Ambulatory Visit | Attending: Urology | Admitting: Urology

## 2019-08-26 DIAGNOSIS — C61 Malignant neoplasm of prostate: Secondary | ICD-10-CM

## 2019-08-26 HISTORY — DX: Essential (primary) hypertension: I10

## 2019-08-26 LAB — POCT I-STAT CREATININE: Creatinine, Ser: 1.2 mg/dL (ref 0.61–1.24)

## 2019-08-26 MED ORDER — IOHEXOL 300 MG/ML  SOLN
100.0000 mL | Freq: Once | INTRAMUSCULAR | Status: AC | PRN
  Administered 2019-08-26: 10:00:00 100 mL via INTRAVENOUS

## 2019-08-26 MED ORDER — TECHNETIUM TC 99M MEDRONATE IV KIT
21.3270 | PACK | Freq: Once | INTRAVENOUS | Status: AC | PRN
  Administered 2019-08-26: 21.327 via INTRAVENOUS

## 2019-08-29 ENCOUNTER — Telehealth: Payer: Self-pay

## 2019-08-29 ENCOUNTER — Other Ambulatory Visit: Payer: Self-pay

## 2019-08-29 NOTE — Telephone Encounter (Signed)
-----   Message from Billey Co, MD sent at 08/29/2019  1:45 PM EDT ----- Both CT and bone scan look good, no evidence of prostate cancer spread outside the prostate. Keep follow up with Dr. Baruch Gouty, and call us with how he wants to proceed with treatment  Nickolas Madrid, MD 08/29/2019

## 2019-08-29 NOTE — Telephone Encounter (Signed)
Patient notified

## 2019-08-30 ENCOUNTER — Encounter: Payer: Self-pay | Admitting: Radiation Oncology

## 2019-08-30 ENCOUNTER — Ambulatory Visit
Admission: RE | Admit: 2019-08-30 | Discharge: 2019-08-30 | Disposition: A | Discharge status: Home / Self Care | Payer: Managed Care, Other (non HMO) | Source: Ambulatory Visit | Attending: Radiation Oncology | Admitting: Radiation Oncology

## 2019-08-30 ENCOUNTER — Other Ambulatory Visit: Payer: Self-pay

## 2019-08-30 VITALS — BP 117/74 | HR 57 | Temp 97.3°F | Resp 16 | Wt 199.6 lb

## 2019-08-30 DIAGNOSIS — C61 Malignant neoplasm of prostate: Secondary | ICD-10-CM | POA: Diagnosis present

## 2019-08-30 NOTE — Progress Notes (Signed)
Radiation Oncology Follow up Note  Name: Antonio [name]   Date:   08/30/2019 MRN:  [id number] DOB: August 09, 1961    This 58 y.o. male presents to the clinic today for stage IIa (T1 cN0 M0) mostly Gleason 7 (3+4) adenocarcinoma the prostate presenting with a PSA of 6.5.  REFERRING PROVIDER: No ref. provider found  HPI: Patient is a 58 year old male presented with elevated PSA of 6.5 found to have a 29 cc prostate.  Transrectal ultrasound-guided biopsy was positive for.  5 of 12 cores positive for mostly Gleason 7 (3+4) adenocarcinoma.  Patient was seen in consultation and treatment options were discussed.  He had a bone scan showing no evidence to suggest metastatic disease.  Also had a CT scan of abdomen pelvis showing heterogeneous prostate with no findings suspicious for local advanced disease or metastatic disease.  He is still debating whether to go ahead with robotic prostatectomy versus I-125 interstitial implant which I have recommended in lieu of surgery.  COMPLICATIONS OF TREATMENT: none  FOLLOW UP COMPLIANCE: keeps appointments   PHYSICAL EXAM:  BP 117/74 (BP Location: Left Arm, Patient Position: Sitting)   Pulse (!) 57   Temp (!) 97.3 F (36.3 C) (Tympanic)   Resp 16   Wt 199 lb 9.6 oz (90.5 kg)   BMI 29.48 kg/m  Well-developed well-nourished patient in NAD. HEENT reveals PERLA, EOMI, discs not visualized.  Oral cavity is clear. No oral mucosal lesions are identified. Neck is clear without evidence of cervical or supraclavicular adenopathy. Lungs are clear to A&P. Cardiac examination is essentially unremarkable with regular rate and rhythm without murmur rub or thrill. Abdomen is benign with no organomegaly or masses noted. Motor sensory and DTR levels are equal and symmetric in the upper and lower extremities. Cranial nerves II through XII are grossly intact. Proprioception is intact. No peripheral adenopathy or edema is identified. No motor or sensory levels are noted. Crude visual  fields are within normal range.  RADIOLOGY RESULTS: Bone scan and CT scans of abdomen pelvis reviewed and compatible with above-stated findings  PLAN: This time we have had a long discussion again about the pros and cons of both robotic prostatectomy versus I-125 interstitial implant.  I have gone over the benefit of robotic prostatectomy that if there is recurrence or biochemical failure we can always salvage again with radiation therapy which is not the case if we go ahead with seed implant.  Risks and benefits of seed implant including increased lower urinary tract symptoms fatigue risks of general anesthesia and GI side effects all were discussed in detail again.  Patient is still leery of going ahead with robotic prostatectomy.  I am going to head and start planning and arranging for volume study in anticipation of I-125 interstitial implant.  Patient knows over the next several weeks ago is canceled that and I have recommended he revisit urology to discuss robotic prostatectomy if that is his decision.  I would like to take this opportunity to thank you for allowing me to participate in the care of your patient.Noreene Filbert, MD

## 2019-08-31 ENCOUNTER — Other Ambulatory Visit: Payer: Self-pay | Admitting: Radiology

## 2019-08-31 DIAGNOSIS — C61 Malignant neoplasm of prostate: Secondary | ICD-10-CM

## 2019-09-16 ENCOUNTER — Other Ambulatory Visit
Admission: RE | Admit: 2019-09-16 | Discharge: 2019-09-16 | Disposition: A | Discharge status: Home / Self Care | Payer: Managed Care, Other (non HMO) | Source: Ambulatory Visit | Attending: Radiation Oncology | Admitting: Radiation Oncology

## 2019-09-16 ENCOUNTER — Other Ambulatory Visit: Payer: Self-pay

## 2019-09-16 DIAGNOSIS — Z01812 Encounter for preprocedural laboratory examination: Secondary | ICD-10-CM | POA: Insufficient documentation

## 2019-09-16 DIAGNOSIS — Z20828 Contact with and (suspected) exposure to other viral communicable diseases: Secondary | ICD-10-CM | POA: Insufficient documentation

## 2019-09-16 LAB — SARS CORONAVIRUS 2 (TAT 6-24 HRS): SARS Coronavirus 2: NEGATIVE

## 2019-09-21 ENCOUNTER — Ambulatory Visit
Admission: RE | Admit: 2019-09-21 | Discharge: 2019-09-21 | Disposition: A | Discharge status: Home / Self Care | Payer: Managed Care, Other (non HMO) | Source: Ambulatory Visit | Attending: Radiation Oncology | Admitting: Radiation Oncology

## 2019-09-21 ENCOUNTER — Ambulatory Visit (INDEPENDENT_AMBULATORY_CARE_PROVIDER_SITE_OTHER): Payer: Managed Care, Other (non HMO) | Admitting: Urology

## 2019-09-21 ENCOUNTER — Ambulatory Visit: Admission: RE | Admit: 2019-09-21 | Payer: Managed Care, Other (non HMO) | Source: Home / Self Care

## 2019-09-21 ENCOUNTER — Encounter: Payer: Self-pay | Admitting: Radiation Oncology

## 2019-09-21 ENCOUNTER — Encounter: Payer: Self-pay | Admitting: Urology

## 2019-09-21 ENCOUNTER — Other Ambulatory Visit: Payer: Self-pay

## 2019-09-21 VITALS — BP 177/82 | HR 86 | Ht 69.0 in | Wt 199.0 lb

## 2019-09-21 VITALS — BP 149/80 | HR 49 | Temp 97.4°F | Resp 16 | Wt 199.7 lb

## 2019-09-21 DIAGNOSIS — C61 Malignant neoplasm of prostate: Secondary | ICD-10-CM

## 2019-09-21 DIAGNOSIS — N368 Other specified disorders of urethra: Secondary | ICD-10-CM | POA: Diagnosis not present

## 2019-09-21 SURGERY — ULTRASOUND, PROSTATE, FOR VOLUME DETERMINATION
Anesthesia: Choice

## 2019-09-21 MED ORDER — CIPROFLOXACIN IN D5W 400 MG/200ML IV SOLN
400.0000 mg | INTRAVENOUS | Status: AC
  Administered 2019-10-17: 400 mg via INTRAVENOUS

## 2019-09-21 NOTE — Progress Notes (Signed)
09/21/19  Called emergently to the operating room to assess urethral bleeding.  Patient was undergoing volume study in the operating room with Dr. Donella Stade.  Foley catheter was placed without difficulty but upon Foley removal, the patient was noted to have some pulsatile bleeding from the distal aspect of his penis.  Compression was held circumferentially on the urethra for approximately 5 minutes or so and the bleeding dramatically improved/subsided.  Suspect trauma of a superficial arterial vessel within the distal urethra.  Bleeding now under control.  Patient was dressed using mesh panties as well as a compressive dressing with scrotal fluffs.  He was advised to call us within an hour if the bleeding does not completely subside or call us by this afternoon if he is unable to void.  Hollice Espy, MD  Addendum: Called patient later this morning to check on him, passed a small clot and able to void clear yellow urine.  He is had a small bloody trickle but overall the bleeding appears to have stopped for the most part.

## 2019-09-21 NOTE — Progress Notes (Signed)
09/21/2019 5:44 PM   Antonio [name] 04/06/1961 [id number]  Referring provider: No referring provider defined for this encounter.  Chief Complaint  Patient presents with  . Hematuria    HPI: 58 year old male with prostate cancer scheduled to undergo radiation seed placement who was seen earlier today in the operating room.  He underwent volume study requiring Foley catheter placement this morning.  Upon removal of the Foley catheter, he began to have pulsatile bleeding from his distal shaft.  This is initially fairly significant but subsided with manual compression of the urethra.  Ultimately, he is able to be sent home with supportive care.  Called to check on him a few hours after leaving the hospital and he was doing well with minimal to no bleeding had been able to void with a small clot.  He called back later this afternoon at which time he voided again with more blood in his urine and after he voided, the small amount of bleeding drops were present which has been a slow steady ooze.  He denies any further pulsatile bleeding.  He was advised to come in for assessment.  In the office, he was able to void again at which time his urine appeared to be cranberry colored with a small urethral shaped clot.  Bladder scan indicated that he emptied well.  On exam, he did have a very small amount of blood at the meatus but no active bleeding noted.  He was wearing some gauze wrapped around his penis which he had placed there about 30 minutes ago which contained about estimated 5 cc of blood.   PMH: Past Medical History:  Diagnosis Date  . Aortic aneurysm ([place])   . Aortic insufficiency 07/12/2014   Overview:  Mild to moderate  . H/O bicuspid aortic valve 11/04/2013  . Heart palpitations 05/15/2017  . Hypertension   . Metabolic syndrome 99991111  . OSA (obstructive sleep apnea) 06/11/2018  . Premature ventricular contractions 05/28/2017  . S/P aortic valve replacement with bioprosthetic  valve 11/04/2013   Overview:  With carpentier-edwards bovine pericardial valve and aortic aneurysm repair 03/26/06 by Dr Ysidro Evert at [facility]    Surgical History: Past Surgical History:  Procedure Laterality Date  . valve replacement      Home Medications:  Allergies as of 09/21/2019   No Known Allergies     Medication List       Accurate as of September 21, 2019  5:44 PM. If you have any questions, ask your nurse or doctor.        acetaminophen 500 MG tablet Commonly known as: TYLENOL Take 1,000 mg by mouth every 6 (six) hours as needed (for pain.).   aspirin EC 81 MG tablet Take 81 mg by mouth daily.   atorvastatin 10 MG tablet Commonly known as: LIPITOR Take 10 mg by mouth every evening.   cetirizine 10 MG tablet Commonly known as: ZYRTEC Take 10 mg by mouth daily.   Lubricant Eye Drops 0.4-0.3 % Soln Generic drug: Polyethyl Glycol-Propyl Glycol Place 1 drop into both eyes 3 (three) times daily as needed (dry/irritated eyes.).   metoprolol succinate 50 MG 24 hr tablet Commonly known as: TOPROL-XL Take 50 mg by mouth daily. Take with or immediately following a meal.       Allergies: No Known Allergies  Family History: Family History  Problem Relation Age of Onset  . Hypertension Mother     Social History:  reports that he has never smoked. He has never used  smokeless tobacco. He reports that he does not drink alcohol or use drugs.  ROS: UROLOGY Frequent Urination?: Yes Hard to postpone urination?: No Burning/pain with urination?: No Get up at night to urinate?: No Leakage of urine?: No Urine stream starts and stops?: No Trouble starting stream?: No Do you have to strain to urinate?: No Blood in urine?: Yes Urinary tract infection?: No Sexually transmitted disease?: No Injury to kidneys or bladder?: No Painful intercourse?: No Weak stream?: No Erection problems?: No Penile pain?: No  Gastrointestinal Nausea?: No Vomiting?: No  Indigestion/heartburn?: No Diarrhea?: No Constipation?: No  Constitutional Fever: No Night sweats?: No Weight loss?: No Fatigue?: No  Skin Skin rash/lesions?: No Itching?: No  Eyes Blurred vision?: No Double vision?: No  Ears/Nose/Throat Sore throat?: No Sinus problems?: No  Hematologic/Lymphatic Swollen glands?: No Easy bruising?: No  Cardiovascular Leg swelling?: No Chest pain?: No  Respiratory Cough?: No Shortness of breath?: No  Endocrine Excessive thirst?: No  Musculoskeletal Back pain?: No Joint pain?: No  Neurological Headaches?: No Dizziness?: No  Psychologic Depression?: No Anxiety?: No  Physical Exam: BP (!) 177/82   Pulse 86   Ht 5\' 9"  (1.753 m)   Wt 199 lb (90.3 kg)   BMI 29.39 kg/m   Constitutional:  Alert and oriented, No acute distress. HEENT: [place] AT, moist mucus membranes.  Trachea midline, no masses. Cardiovascular: No clubbing, cyanosis, or edema. Respiratory: Normal respiratory effort, no increased work of breathing. GI: Abdomen is soft, nontender, nondistended, no abdominal masses GU: Penile exam as above, bilateral descended testicles.. Skin: No rashes, bruises or suspicious lesions. Neurologic: Grossly intact, no focal deficits, moving all 4 extremities. Psychiatric: Normal mood and affect.  Laboratory Data:  Lab Results  Component Value Date   CREATININE 1.20 08/26/2019    Assessment & Plan:    1. Urethral bleeding Persistent urethral bleeding, although it appears to have significantly slowed and is no longer pulsatile as it was this morning.  He is able to empty his bladder today.  At this point in time, we discussed continued conservative management with urethral compression as needed, copious fluid intake in hopes that this will subside spontaneously.  Alternatively, he is offered Foley catheter placement today to help tamponade the bleeding and reduce the risk of clot retention.  He would rather pursue  conservative management.  I feel reasonably comfortable that he will do well overnight and overall is improving.  He was advised that if he is unable to void or if the bleeding becomes more significant, he should present to the emergency room.  He will touch base with me in the morning either via MyChart message or call the office let us know how he is doing.  2. Prostate cancer [facility]) Management per Dr. Donella Stade and Nancee Liter, [place] [address] [place], [place] [address] [phone]

## 2019-09-21 NOTE — H&P (Signed)
History and physical  Name: Antonio [name]  MRN: [id number]  Date:   09/21/2019     DOB: [birth year]   This 58 y.o. 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 provider found  CHIEF COMPLAINT:  Chief Complaint  Patient presents with  . Prostate Cancer    Post volume study    DIAGNOSIS: The encounter diagnosis was Prostate cancer ([place]).   PREVIOUS INVESTIGATIONS:  Pathology report reviewed Clinical notes reviewed  HPI: Patient is seen today for history and physical and volume study in anticipation of I-125 interstitial implant for stage IIa (T1 cN0 M0) mostly Gleason 7 (3+4) adenocarcinoma the prostate presenting with a PSA of 6.5.  He is seen today and doing well specifically denies any increased lower urinary tract symptoms diarrhea or fatigue.  He is also seen today after going to the OR for volume study in anticipation of I-125 interstitial implant.  PLANNED TREATMENT REGIMEN: I-125 interstitial implant  PAST MEDICAL HISTORY:  has a past medical history of Aortic aneurysm ([place]), Aortic insufficiency (07/12/2014), H/O bicuspid aortic valve (11/04/2013), Heart palpitations (05/15/2017), Hypertension, Metabolic syndrome (99991111), OSA (obstructive sleep apnea) (06/11/2018), Premature ventricular contractions (05/28/2017), and S/P aortic valve replacement with bioprosthetic valve (11/04/2013).    PAST SURGICAL HISTORY:  Past Surgical History:  Procedure Laterality Date  . valve replacement      FAMILY HISTORY: family history includes Hypertension in his mother.  SOCIAL HISTORY:  reports that he has never smoked. He has never used smokeless tobacco. He reports that he does not drink alcohol or use drugs.  ALLERGIES: Patient has no known allergies.  MEDICATIONS:  Current Outpatient Medications  Medication Sig Dispense Refill  .  acetaminophen (TYLENOL) 500 MG tablet Take 1,000 mg by mouth every 6 (six) hours as needed (for pain.).    Marland Kitchen aspirin EC 81 MG tablet Take 81 mg by mouth daily.    Marland Kitchen atorvastatin (LIPITOR) 10 MG tablet Take 10 mg by mouth every evening.     . cetirizine (ZYRTEC) 10 MG tablet Take 10 mg by mouth daily.    . metoprolol succinate (TOPROL-XL) 50 MG 24 hr tablet Take 50 mg by mouth daily. Take with or immediately following a meal.    . Polyethyl Glycol-Propyl Glycol (LUBRICANT EYE DROPS) 0.4-0.3 % SOLN Place 1 drop into both eyes 3 (three) times daily as needed (dry/irritated eyes.).     No current facility-administered medications for this encounter.    Facility-Administered Medications Ordered in Other Encounters  Medication Dose Route Frequency Provider Last Rate Last Dose  . ciprofloxacin (CIPRO) IVPB 400 mg  400 mg Intravenous 60 min Pre-Op Billey Co, MD        ECOG PERFORMANCE STATUS:  0 - Asymptomatic  REVIEW OF SYSTEMS: Patient denies any weight loss, fatigue, weakness, fever, chills or night sweats. Patient denies any loss of vision, blurred vision. Patient denies any ringing  of the ears or hearing loss. No irregular heartbeat. Patient denies heart murmur or history of fainting. Patient denies any chest pain or pain radiating to her upper extremities. Patient denies any shortness of breath, difficulty breathing at night, cough or hemoptysis. Patient denies any swelling in the lower legs. Patient denies any nausea vomiting, vomiting of blood, or coffee ground material in the vomitus. Patient denies any stomach pain. Patient states has had normal bowel movements no significant constipation  or diarrhea. Patient denies any dysuria, hematuria or significant nocturia. Patient denies any problems walking, swelling in the joints or loss of balance. Patient denies any skin changes, loss of hair or loss of weight. Patient denies any excessive worrying or anxiety or significant depression. Patient  denies any problems with insomnia. Patient denies excessive thirst, polyuria, polydipsia. Patient denies any swollen glands, patient denies easy bruising or easy bleeding. Patient denies any recent infections, allergies or URI. Patient "s visual fields have not changed significantly in recent time.   PHYSICAL EXAM: BP (!) 149/80 (BP Location: Left Arm, Patient Position: Sitting)   Pulse (!) 49   Temp (!) 97.4 F (36.3 C) (Tympanic)   Resp 16   Wt 199 lb 11.2 oz (90.6 kg)   BMI 29.49 kg/m  Well-developed well-nourished patient in NAD. HEENT reveals PERLA, EOMI, discs not visualized.  Oral cavity is clear. No oral mucosal lesions are identified. Neck is clear without evidence of cervical or supraclavicular adenopathy. Lungs are clear to A&P. Cardiac examination is essentially unremarkable with regular rate and rhythm without murmur rub or thrill. Abdomen is benign with no organomegaly or masses noted. Motor sensory and DTR levels are equal and symmetric in the upper and lower extremities. Cranial nerves II through XII are grossly intact. Proprioception is intact. No peripheral adenopathy or edema is identified. No motor or sensory levels are noted. Crude visual fields are within normal range.  LABORATORY DATA: Pathology report reviewed    RADIOLOGY RESULTS: Ultrasound used for volume study today   IMPRESSION: Stage IIa Gleason 7 adenocarcinoma the prostate in 58 year old male for I-125 interstitial implant  PLAN: This time patient is medically cleared to go ahead with I-125 interstitial implant.  Risks and benefits as well as radiation safety precautions were reviewed.  He was alerted that he will be radioactive for 2 months to avoid close contact with small children and pregnant women.  He also may have increased lower urinary tract symptoms such as urgency frequency and nocturia.  Also may experience fatigue and slight possible diarrhea.  Patient comprehends her treatment plan well.  Also risks  of general anesthesia were reviewed.  I would like to take this opportunity to thank you for allowing me to participate in the care of your patient.Noreene Filbert, MD

## 2019-09-21 NOTE — Progress Notes (Signed)
Radiation Oncology Volume study note  Name: Antonio [name]   Date:   09/21/2019 MRN:  [id number] DOB: 1961-05-01    This 58 y.o. 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  He is seen today and doing well specifically denies any increased lower urinary tract symptoms diarrhea or fatigue.  He is also seen today after going to the OR for volume study in anticipation of I-125 interstitial implant.  COMPLICATIONS OF TREATMENT: present patient experienced bleeding from his penis after removal of his catheter.  FOLLOW UP COMPLIANCE: keeps appointments   PHYSICAL EXAM:  There were no vitals taken for this visit. Well-developed well-nourished patient in NAD. HEENT reveals PERLA, EOMI, discs not visualized.  Oral cavity is clear. No oral mucosal lesions are identified. Neck is clear without evidence of cervical or supraclavicular adenopathy. Lungs are clear to A&P. Cardiac examination is essentially unremarkable with regular rate and rhythm without murmur rub or thrill. Abdomen is benign with no organomegaly or masses noted. Motor sensory and DTR levels are equal and symmetric in the upper and lower extremities. Cranial nerves II through XII are grossly intact. Proprioception is intact. No peripheral adenopathy or edema is identified. No motor or sensory levels are noted. Crude visual fields are within normal range.  RADIOLOGY RESULTS: Ultrasound used for volume study  PLAN: Patient was taken to the cystoscopy suite in the OR. Patient was placed in the low lithotomy position. Foley catheter was placed. Trans-rectal ultrasound probe was  inserted into the rectum and prostate seminal vesicles were visualized as well as bladder base. stepping images were performed on a 5 mm increments. Images will be placed in Brachy[facility]ision treatment planning system to determine seed placement coordinates for eventual I-125 interstitial implant. Images will be reviewed with the physics and dosimetry staff for final quality approval. I personally was present for the volume study and assisted in delineation of contour volumes.  At the end of the procedure Foley catheter was removed, rectal ultrasound probe was removed. Patient tolerated his procedures extremely well.  He did develop some intermittent bleeding from his penis at the completion of therapy.  We called Dr. Erlene Quan for evaluation.  Pressure was applied it seemed to have diminished by the time he was discharged home patient was feeling fine.  He will follow up with Dr. Erlene Quan as needed should he continue to bleed later today.. Patient has given appointment for interstitial implant date. Consent was signed today as well as history and physical performed in preparation for his outpatient surgical implant.     Noreene Filbert, MD

## 2019-09-22 ENCOUNTER — Encounter: Payer: Self-pay | Admitting: Urology

## 2019-09-23 DIAGNOSIS — C61 Malignant neoplasm of prostate: Secondary | ICD-10-CM | POA: Diagnosis not present

## 2019-10-11 ENCOUNTER — Other Ambulatory Visit: Payer: Self-pay

## 2019-10-11 ENCOUNTER — Encounter
Admission: RE | Admit: 2019-10-11 | Discharge: 2019-10-11 | Disposition: A | Discharge status: Home / Self Care | Payer: Managed Care, Other (non HMO) | Source: Ambulatory Visit | Attending: Urology | Admitting: Urology

## 2019-10-11 DIAGNOSIS — Z01818 Encounter for other preprocedural examination: Secondary | ICD-10-CM | POA: Diagnosis present

## 2019-10-11 DIAGNOSIS — I10 Essential (primary) hypertension: Secondary | ICD-10-CM | POA: Diagnosis not present

## 2019-10-11 LAB — CBC
HCT: 41.4 % (ref 39.0–52.0)
Hemoglobin: 13.9 g/dL (ref 13.0–17.0)
MCH: 30.2 pg (ref 26.0–34.0)
MCHC: 33.6 g/dL (ref 30.0–36.0)
MCV: 89.8 fL (ref 80.0–100.0)
Platelets: 183 10*3/uL (ref 150–400)
RBC: 4.61 MIL/uL (ref 4.22–5.81)
RDW: 13 % (ref 11.5–15.5)
WBC: 6.6 10*3/uL (ref 4.0–10.5)
nRBC: 0 % (ref 0.0–0.2)

## 2019-10-11 NOTE — Patient Instructions (Signed)
Your procedure is scheduled on: Mon 11/23 Report to Day Surgery. To find out your arrival time please call [phone] between 1PM - 3PM on Frid.11/20  Remember: Instructions that are not followed completely may result in serious medical risk,  up to and including death, or upon the discretion of your surgeon and anesthesiologist your  surgery may need to be rescheduled.     _X__ 1. Do not eat food after midnight the night before your procedure.                 No gum chewing or hard candies. You may drink clear liquids up to 2 hours                 before you are scheduled to arrive for your surgery- DO not drink clear                 liquids within 2 hours of the start of your surgery.                 Clear Liquids include:  water, apple juice without pulp, clear carbohydrate                 drink such as Clearfast of Gatorade, Black Coffee or Tea (Do not add                 anything to coffee or tea).  __X__2.  On the morning of surgery brush your teeth with toothpaste and water, you                may rinse your mouth with mouthwash if you wish.  Do not swallow any toothpaste of mouthwash.     ___ 3.  No Alcohol for 24 hours before or after surgery.   ___ 4.  Do Not Smoke or use e-cigarettes For 24 Hours Prior to Your Surgery.                 Do not use any chewable tobacco products for at least 6 hours prior to                 surgery.  ____  5.  Bring all medications with you on the day of surgery if instructed.   __x__  6.  Notify your doctor if there is any change in your medical condition      (cold, fever, infections).     Do not wear jewelry, make-up, hairpins, clips or nail polish. Do not wear lotions, powders, or perfumes. You may wear deodorant. Do not shave 48 hours prior to surgery. Men may shave face and neck. Do not bring valuables to the hospital.    [facility] is not responsible for any belongings or valuables.  Contacts, dentures or  bridgework may not be worn into surgery. Leave your suitcase in the car. After surgery it may be brought to your room. For patients admitted to the hospital, discharge time is determined by your treatment team.   Patients discharged the day of surgery will not be allowed to drive home.   Please read over the following fact sheets that you were given:    __x__ Take these medicines the morning of surgery with A SIP OF WATER:    1. metoprolol succinate (TOPROL-XL) 50 MG 24 hr tablet  2. cetirizine (ZYRTEC) 10 MG tablet  3. acetaminophen (TYLENOL) 500 MG tablet  If needed  4.  5.  6.  ____ Fleet Enema (as  directed)   ____ Use CHG Soap as directed  ____ Use inhalers on the day of surgery  ____ Stop metformin 2 days prior to surgery    ____ Take 1/2 of usual insulin dose the night before surgery. No insulin the morning          of surgery.   __x__ Stopped aspirin on 11/14  ____ Stop Anti-inflammatories on    ____ Stop supplements until after surgery.    ____ Bring C-Pap to the hospital.

## 2019-10-13 ENCOUNTER — Other Ambulatory Visit
Admission: RE | Admit: 2019-10-13 | Discharge: 2019-10-13 | Disposition: A | Discharge status: Home / Self Care | Payer: Managed Care, Other (non HMO) | Source: Ambulatory Visit | Attending: Urology | Admitting: Urology

## 2019-10-13 DIAGNOSIS — Z01812 Encounter for preprocedural laboratory examination: Secondary | ICD-10-CM | POA: Diagnosis present

## 2019-10-13 DIAGNOSIS — Z20828 Contact with and (suspected) exposure to other viral communicable diseases: Secondary | ICD-10-CM | POA: Diagnosis not present

## 2019-10-13 LAB — SARS CORONAVIRUS 2 (TAT 6-24 HRS): SARS Coronavirus 2: NEGATIVE

## 2019-10-17 ENCOUNTER — Ambulatory Visit: Payer: Managed Care, Other (non HMO) | Admitting: Certified Registered Nurse Anesthetist

## 2019-10-17 ENCOUNTER — Encounter
Admission: RE | Disposition: A | Discharge status: Home / Self Care | Payer: Self-pay | Source: Home / Self Care | Attending: Urology

## 2019-10-17 ENCOUNTER — Other Ambulatory Visit: Payer: Self-pay

## 2019-10-17 ENCOUNTER — Ambulatory Visit: Payer: Managed Care, Other (non HMO)

## 2019-10-17 ENCOUNTER — Ambulatory Visit
Admission: RE | Admit: 2019-10-17 | Discharge: 2019-10-17 | Disposition: A | Discharge status: Home / Self Care | Payer: Managed Care, Other (non HMO) | Attending: Urology | Admitting: Urology

## 2019-10-17 ENCOUNTER — Ambulatory Visit
Admission: RE | Admit: 2019-10-17 | Discharge: 2019-10-17 | Disposition: A | Discharge status: Home / Self Care | Payer: Managed Care, Other (non HMO) | Source: Ambulatory Visit | Attending: Radiation Oncology | Admitting: Radiation Oncology

## 2019-10-17 ENCOUNTER — Encounter: Payer: Self-pay | Admitting: Emergency Medicine

## 2019-10-17 DIAGNOSIS — G4733 Obstructive sleep apnea (adult) (pediatric): Secondary | ICD-10-CM | POA: Insufficient documentation

## 2019-10-17 DIAGNOSIS — I351 Nonrheumatic aortic (valve) insufficiency: Secondary | ICD-10-CM | POA: Insufficient documentation

## 2019-10-17 DIAGNOSIS — E8881 Metabolic syndrome: Secondary | ICD-10-CM | POA: Insufficient documentation

## 2019-10-17 DIAGNOSIS — I10 Essential (primary) hypertension: Secondary | ICD-10-CM | POA: Diagnosis not present

## 2019-10-17 DIAGNOSIS — Z7982 Long term (current) use of aspirin: Secondary | ICD-10-CM | POA: Insufficient documentation

## 2019-10-17 DIAGNOSIS — Z79899 Other long term (current) drug therapy: Secondary | ICD-10-CM | POA: Diagnosis not present

## 2019-10-17 DIAGNOSIS — C61 Malignant neoplasm of prostate: Secondary | ICD-10-CM | POA: Diagnosis present

## 2019-10-17 DIAGNOSIS — Z952 Presence of prosthetic heart valve: Secondary | ICD-10-CM | POA: Diagnosis not present

## 2019-10-17 HISTORY — PX: RADIOACTIVE SEED IMPLANT: SHX5150

## 2019-10-17 HISTORY — PX: CYSTOSCOPY: SHX5120

## 2019-10-17 SURGERY — INSERTION, RADIATION SOURCE, PROSTATE
Anesthesia: General | Site: Prostate

## 2019-10-17 MED ORDER — GLYCOPYRROLATE 0.2 MG/ML IJ SOLN
INTRAMUSCULAR | Status: AC
  Filled 2019-10-17: qty 1

## 2019-10-17 MED ORDER — SUCCINYLCHOLINE CHLORIDE 20 MG/ML IJ SOLN
INTRAMUSCULAR | Status: AC
  Filled 2019-10-17: qty 1

## 2019-10-17 MED ORDER — KETOROLAC TROMETHAMINE 30 MG/ML IJ SOLN
INTRAMUSCULAR | Status: DC | PRN
  Administered 2019-10-17: 15 mg via INTRAVENOUS

## 2019-10-17 MED ORDER — BACITRACIN ZINC 500 UNIT/GM EX OINT
TOPICAL_OINTMENT | CUTANEOUS | Status: AC
  Filled 2019-10-17: qty 28.35

## 2019-10-17 MED ORDER — FENTANYL CITRATE (PF) 100 MCG/2ML IJ SOLN
INTRAMUSCULAR | Status: DC | PRN
  Administered 2019-10-17: 50 ug via INTRAVENOUS

## 2019-10-17 MED ORDER — FLEET ENEMA 7-19 GM/118ML RE ENEM: 1.0000 | ENEMA | Freq: Once | RECTAL | Status: DC

## 2019-10-17 MED ORDER — SUGAMMADEX SODIUM 200 MG/2ML IV SOLN
INTRAVENOUS | Status: AC
  Filled 2019-10-17: qty 2

## 2019-10-17 MED ORDER — ROCURONIUM BROMIDE 100 MG/10ML IV SOLN: INTRAVENOUS | Status: DC | PRN

## 2019-10-17 MED ORDER — TAMSULOSIN HCL 0.4 MG PO CAPS: 0.4000 mg | ORAL_CAPSULE | Freq: Every day | ORAL | 0 refills | Status: DC

## 2019-10-17 MED ORDER — ROCURONIUM BROMIDE 50 MG/5ML IV SOLN
INTRAVENOUS | Status: AC
  Filled 2019-10-17: qty 1

## 2019-10-17 MED ORDER — KETOROLAC TROMETHAMINE 30 MG/ML IJ SOLN
INTRAMUSCULAR | Status: AC
  Filled 2019-10-17: qty 1

## 2019-10-17 MED ORDER — ONDANSETRON HCL 4 MG/2ML IJ SOLN
INTRAMUSCULAR | Status: DC | PRN
  Administered 2019-10-17: 4 mg via INTRAVENOUS

## 2019-10-17 MED ORDER — ACETAMINOPHEN 10 MG/ML IV SOLN
INTRAVENOUS | Status: DC | PRN
  Administered 2019-10-17: 1000 mg via INTRAVENOUS

## 2019-10-17 MED ORDER — MIDAZOLAM HCL 2 MG/2ML IJ SOLN
INTRAMUSCULAR | Status: AC
  Filled 2019-10-17: qty 2

## 2019-10-17 MED ORDER — PHENYLEPHRINE HCL (PRESSORS) 10 MG/ML IV SOLN
INTRAVENOUS | Status: AC
  Filled 2019-10-17: qty 1

## 2019-10-17 MED ORDER — LIDOCAINE HCL (PF) 2 % IJ SOLN
INTRAMUSCULAR | Status: AC
  Filled 2019-10-17: qty 10

## 2019-10-17 MED ORDER — ROCURONIUM 10MG/ML (10ML) SYRINGE FOR MEDFUSION PUMP - OPTIME
INTRAVENOUS | Status: DC | PRN
  Administered 2019-10-17: 25 mg via INTRAVENOUS
  Administered 2019-10-17: 5 mg via INTRAVENOUS
  Administered 2019-10-17: 10 mg via INTRAVENOUS

## 2019-10-17 MED ORDER — DEXAMETHASONE SODIUM PHOSPHATE 10 MG/ML IJ SOLN
INTRAMUSCULAR | Status: DC | PRN
  Administered 2019-10-17: 10 mg via INTRAVENOUS

## 2019-10-17 MED ORDER — CIPROFLOXACIN IN D5W 400 MG/200ML IV SOLN
INTRAVENOUS | Status: AC
  Filled 2019-10-17: qty 200

## 2019-10-17 MED ORDER — FENTANYL CITRATE (PF) 100 MCG/2ML IJ SOLN: 25.0000 ug | INTRAMUSCULAR | Status: DC | PRN

## 2019-10-17 MED ORDER — MIDAZOLAM HCL 2 MG/2ML IJ SOLN
INTRAMUSCULAR | Status: DC | PRN
  Administered 2019-10-17: 2 mg via INTRAVENOUS

## 2019-10-17 MED ORDER — FENTANYL CITRATE (PF) 100 MCG/2ML IJ SOLN
INTRAMUSCULAR | Status: AC
  Filled 2019-10-17: qty 2

## 2019-10-17 MED ORDER — SUGAMMADEX SODIUM 200 MG/2ML IV SOLN
INTRAVENOUS | Status: DC | PRN
  Administered 2019-10-17: 200 mg via INTRAVENOUS

## 2019-10-17 MED ORDER — CIPROFLOXACIN HCL 500 MG PO TABS: 500.0000 mg | ORAL_TABLET | Freq: Two times a day (BID) | ORAL | 0 refills | Status: AC

## 2019-10-17 MED ORDER — PROPOFOL 10 MG/ML IV BOLUS
INTRAVENOUS | Status: DC | PRN
  Administered 2019-10-17: 150 mg via INTRAVENOUS

## 2019-10-17 MED ORDER — FAMOTIDINE 20 MG PO TABS
ORAL_TABLET | ORAL | Status: AC
  Administered 2019-10-17: 06:00:00 20 mg via ORAL
  Filled 2019-10-17: qty 1

## 2019-10-17 MED ORDER — PHENYLEPHRINE HCL (PRESSORS) 10 MG/ML IV SOLN
INTRAVENOUS | Status: DC | PRN
  Administered 2019-10-17: 100 ug via INTRAVENOUS
  Administered 2019-10-17 (×3): 50 ug via INTRAVENOUS
  Administered 2019-10-17: 100 ug via INTRAVENOUS

## 2019-10-17 MED ORDER — BACITRACIN 500 UNIT/GM EX OINT
TOPICAL_OINTMENT | CUTANEOUS | Status: DC | PRN
  Administered 2019-10-17: 1 via TOPICAL

## 2019-10-17 MED ORDER — FAMOTIDINE 20 MG PO TABS
20.0000 mg | ORAL_TABLET | Freq: Once | ORAL | Status: AC
  Administered 2019-10-17: 06:00:00 20 mg via ORAL

## 2019-10-17 MED ORDER — LACTATED RINGERS IV SOLN
INTRAVENOUS | Status: DC
  Administered 2019-10-17: 06:00:00 via INTRAVENOUS

## 2019-10-17 MED ORDER — ACETAMINOPHEN 10 MG/ML IV SOLN
INTRAVENOUS | Status: AC
  Filled 2019-10-17: qty 100

## 2019-10-17 MED ORDER — LIDOCAINE HCL (CARDIAC) PF 100 MG/5ML IV SOSY
PREFILLED_SYRINGE | INTRAVENOUS | Status: DC | PRN
  Administered 2019-10-17: 80 mg via INTRAVENOUS

## 2019-10-17 MED ORDER — SUCCINYLCHOLINE CHLORIDE 20 MG/ML IJ SOLN
INTRAMUSCULAR | Status: DC | PRN
  Administered 2019-10-17: 100 mg via INTRAVENOUS

## 2019-10-17 MED ORDER — GLYCOPYRROLATE 0.2 MG/ML IJ SOLN
INTRAMUSCULAR | Status: DC | PRN
  Administered 2019-10-17 (×2): 0.1 mg via INTRAVENOUS

## 2019-10-17 MED ORDER — PROPOFOL 10 MG/ML IV BOLUS
INTRAVENOUS | Status: AC
  Filled 2019-10-17: qty 40

## 2019-10-17 MED ORDER — OXYCODONE HCL 5 MG/5ML PO SOLN: 5.0000 mg | Freq: Once | ORAL | Status: DC | PRN

## 2019-10-17 MED ORDER — OXYCODONE HCL 5 MG PO TABS: 5.0000 mg | ORAL_TABLET | Freq: Once | ORAL | Status: DC | PRN

## 2019-10-17 MED ORDER — HYDROCODONE-ACETAMINOPHEN 5-325 MG PO TABS: 1.0000 | ORAL_TABLET | ORAL | 0 refills | Status: AC | PRN

## 2019-10-17 SURGICAL SUPPLY — 26 items
BAG URINE DRAIN 2000ML AR STRL (UROLOGICAL SUPPLIES) ×4 IMPLANT
BLADE CLIPPER SURG (BLADE) ×4 IMPLANT
BRUSH SCRUB EZ 1% IODOPHOR (MISCELLANEOUS) ×4 IMPLANT
CATH FOL 2WAY LX 16X5 (CATHETERS) ×4 IMPLANT
COVER BACK TABLE REUSABLE LG (DRAPES) ×4 IMPLANT
DRAPE INCISE 23X17 IOBAN STRL (DRAPES) ×2
DRAPE INCISE 23X17 STRL (DRAPES) ×2 IMPLANT
DRAPE INCISE IOBAN 23X17 STRL (DRAPES) ×2 IMPLANT
DRAPE UNDER BUTTOCK W/FLU (DRAPES) ×4 IMPLANT
DRSG TELFA 3X8 NADH (GAUZE/BANDAGES/DRESSINGS) ×4 IMPLANT
GLOVE BIO SURGEON STRL SZ7.5 (GLOVE) ×8 IMPLANT
GLOVE BIOGEL PI IND STRL 7.5 (GLOVE) ×4 IMPLANT
GLOVE BIOGEL PI INDICATOR 7.5 (GLOVE) ×4
GOWN STRL REUS W/ TWL LRG LVL3 (GOWN DISPOSABLE) ×2 IMPLANT
GOWN STRL REUS W/ TWL XL LVL3 (GOWN DISPOSABLE) ×4 IMPLANT
GOWN STRL REUS W/TWL LRG LVL3 (GOWN DISPOSABLE) ×2
GOWN STRL REUS W/TWL XL LVL3 (GOWN DISPOSABLE) ×4
IV NS 1000ML (IV SOLUTION) ×2
IV NS 1000ML BAXH (IV SOLUTION) ×2 IMPLANT
KIT TURNOVER CYSTO (KITS) ×4 IMPLANT
PACK CYSTO AR (MISCELLANEOUS) ×4 IMPLANT
PAD DRESSING TELFA 3X8 NADH (GAUZE/BANDAGES/DRESSINGS) ×2 IMPLANT
SET CYSTO W/LG BORE CLAMP LF (SET/KITS/TRAYS/PACK) ×4 IMPLANT
SURGILUBE 2OZ TUBE FLIPTOP (MISCELLANEOUS) ×4 IMPLANT
SYR 10ML LL (SYRINGE) ×4 IMPLANT
WATER STERILE IRR 1000ML POUR (IV SOLUTION) ×4 IMPLANT

## 2019-10-17 NOTE — OR Nursing (Signed)
Postop void 100cc watermelon colored urine, no clots noted.  Postop bladder scan 147cc.

## 2019-10-17 NOTE — Anesthesia Post-op Follow-up Note (Signed)
Anesthesia QCDR form completed.        

## 2019-10-17 NOTE — Op Note (Signed)
Preoperative diagnosis: Adenocarcinoma of the prostate    Postoperative diagnosis: Same    Procedure: I-125 prostate seed implantation, cystoscopy   Surgeon: Nickolas Madrid, MD   Radiation Oncologist: Noreene Filbert, M.D.    Anesthesia: General   Drains: none   Complications: none   Indications: Prostate cancer   Procedure: The patient was brought to operating suite and placement table in the supine position. At this time, a universal timeout protocol was performed, all team members were identified, Venodyne boots are placed, and he was administered IV Cipro in the preoperative period. He was placed in lithotomy position and prepped and draped in usual fashion. The radiation oncology department placed a transrectal ultrasound probe anchoring stand/ grid and aligned with previous imaging from the volume study. Foley catheter was inserted without difficulty.  All needle passage was done with real-time transrectal ultrasound guidance in both the transverse and sagittal plains in order to achieve the desired preplanned position. A total of 24 needles were placed.  87 active seeds were implanted. The Foley catheter was removed and a rigid cystoscopy failed to show any seeds outside the prostate without evidence of trauma to the urethral, prostatic fossa, or bladder.  The urethra was normal. The bladder was drained.  A fluoroscopic image was then obtained showing excellent distrubution of the brachytherapy seeds.  Each seed was counted and counts were correct.     The patient was then repositioned in the supine position, reversed from anesthesia, and taken to the PACU in stable condition.  Nickolas Madrid, MD 10/17/2019

## 2019-10-17 NOTE — Anesthesia Postprocedure Evaluation (Signed)
Anesthesia Post Note  Patient: Antonio [name]  Procedure(s) Performed: RADIOACTIVE SEED IMPLANT/BRACHYTHERAPY IMPLANT (N/A Prostate) CYSTOSCOPY (N/A Bladder)  Patient location during evaluation: PACU Anesthesia Type: General Level of consciousness: awake and alert Pain management: pain level controlled Vital Signs Assessment: post-procedure vital signs reviewed and stable Respiratory status: spontaneous breathing, nonlabored ventilation and respiratory function stable Cardiovascular status: blood pressure returned to baseline and stable Postop Assessment: no apparent nausea or vomiting Anesthetic complications: no     Last Vitals:  Vitals:   10/17/19 0928 10/17/19 1038  BP: 116/66 116/76  Pulse: 60 (!) 55  Resp: 16 16  Temp: (!) 36.2 C   SpO2: 99% 100%    Last Pain:  Vitals:   10/17/19 1038  TempSrc:   PainSc: 0-No pain                 Durenda Hurt

## 2019-10-17 NOTE — Progress Notes (Signed)
Radiation Oncology I-125 interstitial implant note  Name: Antonio [name]   Date:   10/17/2019 MRN:  [id number] DOB: Dec 29, 1960    This 58 y.o. male presents to the hospital today for I-125 interstitial implant in patient with stage IIa Gleason 7 adenocarcinoma the prostate  REFERRING PROVIDER: Billey Co, MD  HPI: Patient is a 58 year old male with known stage IIa open Gleason 7 (3+4) adenocarcinoma the prostate (T1c N0 M0).  He presented with a PSA of 6.5.  He had a volume study last month in anticipation of I-125 interstitial implant.  He was taken today to the OR for seed placement..  COMPLICATIONS OF TREATMENT: none  FOLLOW UP COMPLIANCE: keeps appointments   PHYSICAL EXAM:  There were no vitals taken for this visit. Well-developed well-nourished patient in NAD. HEENT reveals PERLA, EOMI, discs not visualized.  Oral cavity is clear. No oral mucosal lesions are identified. Neck is clear without evidence of cervical or supraclavicular adenopathy. Lungs are clear to A&P. Cardiac examination is essentially unremarkable with regular rate and rhythm without murmur rub or thrill. Abdomen is benign with no organomegaly or masses noted. Motor sensory and DTR levels are equal and symmetric in the upper and lower extremities. Cranial nerves II through XII are grossly intact. Proprioception is intact. No peripheral adenopathy or edema is identified. No motor or sensory levels are noted. Crude visual fields are within normal range.  RADIOLOGY RESULTS: Ultrasound plain films used for seed implant  PLAN: Patient was taken to the operating room and general anesthesia was administered. Legs were immobilized in stirrups and patient was positioned in the exact same proportions as original volume study. Patient was prepped and Foley catheter was placed. Ultrasound guidance identified the prostate and recreated the original set up as per treatment planning volume study. 26 needles were placed under  ultrasound guidance with PVCs delivered to the prostate volume.  A total of 87 seeds were deposited after completion of procedure cystoscopy was performed by urology and no evidence of seeds in the bladder were noted. Patient tolerated the procedure extremely well. Initial plain film as doublecheck identified 80 seeds in the prostate. Patient has followup appointment in one month for CT scan for quality assurance will be performed.    Noreene Filbert, MD

## 2019-10-17 NOTE — H&P (Signed)
UROLOGY H&P UPDATE  Agree with prior H&P dated 10/28.  Antonio [name] is a 58 year old male with intermediate risk prostate cancer here today for brachytherapy seed implantation.  Cardiac: RRR Lungs: CTA bilaterally  Laterality: n/a Procedure: Brachytherapy seed implantation  Informed consent obtained, we specifically discussed the risks of bleeding, infection, post-operative pain, need for additional procedures, need for long-term PSA monitoring, prostate cancer recurrence, urinary symptoms/retention, and hematuria.  Billey Co, MD 10/17/2019

## 2019-10-17 NOTE — Transfer of Care (Signed)
Immediate Anesthesia Transfer of Care Note  Patient: Antonio [name]  Procedure(s) Performed: RADIOACTIVE SEED IMPLANT/BRACHYTHERAPY IMPLANT (N/A Prostate) CYSTOSCOPY (N/A Bladder)  Patient Location: PACU  Anesthesia Type:General  Level of Consciousness: sedated  Airway & Oxygen Therapy: Patient Spontanous Breathing and Patient connected to face mask oxygen  Post-op Assessment: Report given to RN and Post -op Vital signs reviewed and stable  Post vital signs: Reviewed and stable  Last Vitals:  Vitals Value Taken Time  BP 115/75 10/17/19 0841  Temp    Pulse 65 10/17/19 0842  Resp 15 10/17/19 0842  SpO2 100 % 10/17/19 0842  Vitals shown include unvalidated device data.  Last Pain:  Vitals:   10/17/19 0619  PainSc: 0-No pain         Complications: No apparent anesthesia complications

## 2019-10-17 NOTE — Anesthesia Preprocedure Evaluation (Addendum)
Anesthesia Evaluation  Patient identified by MRN, date of birth, ID band Patient awake    Reviewed: Allergy & Precautions, H&P , NPO status , Patient's Chart, lab work & pertinent test results  Airway Mallampati: III  TM Distance: <3 FB Neck ROM: full    Dental  (+) Teeth Intact   Pulmonary sleep apnea (does not use CPAP) , neg COPD,           Cardiovascular hypertension, (-) angina(-) Past MI and (-) Cardiac Stents (-) dysrhythmias + Valvular Problems/Murmurs (s/p AVR)   Echo 04/29/19: NORMAL LEFT VENTRICULAR SYSTOLIC FUNCTION WITH MILD LVH  NORMAL LA PRESSURES WITH NORMAL DIASTOLIC FUNCTION  NORMAL RIGHT VENTRICULAR SYSTOLIC FUNCTION  VALVULAR REGURGITATION: MILD AR, MILD MR, SEVERE PR, MILD TR  PROSTHETIC VALVE(S): BIOPROSTHETIC AoV   Neuro/Psych negative neurological ROS  negative psych ROS   GI/Hepatic negative GI ROS, Neg liver ROS,   Endo/Other  negative endocrine ROS  Renal/GU      Musculoskeletal   Abdominal   Peds  Hematology negative hematology ROS (+)   Anesthesia Other Findings Past Medical History: No date: Aortic aneurysm ([place]) 07/12/2014: Aortic insufficiency     Comment:  Overview:  Mild to moderate 11/04/2013: H/O bicuspid aortic valve 05/15/2017: Heart palpitations No date: Hypertension 99991111: Metabolic syndrome [id number]: OSA (obstructive sleep apnea)     Comment:  no CPAP/None suggested 05/28/2017: Premature ventricular contractions 11/04/2013: S/P aortic valve replacement with bioprosthetic valve     Comment:  Overview:  With carpentier-edwards bovine pericardial               valve and aortic aneurysm repair 03/26/06 by Dr Ysidro Evert at              [facility]  Past Surgical History: No date: APPENDECTOMY No date: valve replacement  BMI    Body Mass Index: 29.53 kg/m      Reproductive/Obstetrics negative OB ROS                            Anesthesia  Physical Anesthesia Plan  ASA: II  Anesthesia Plan: General ETT   Post-op Pain Management:    Induction:   PONV Risk Score and Plan: Ondansetron, Dexamethasone, Midazolam and Treatment may vary due to age or medical condition  Airway Management Planned: Video Laryngoscope Planned  Additional Equipment:   Intra-op Plan:   Post-operative Plan:   Informed Consent: I have reviewed the patients History and Physical, chart, labs and discussed the procedure including the risks, benefits and alternatives for the proposed anesthesia with the patient or authorized representative who has indicated his/her understanding and acceptance.     Dental Advisory Given  Plan Discussed with: Anesthesiologist, CRNA and Surgeon  Anesthesia Plan Comments:        Anesthesia Quick Evaluation

## 2019-10-17 NOTE — Anesthesia Procedure Notes (Signed)
Procedure Name: Intubation Performed by: Demetrius Charity, CRNA Pre-anesthesia Checklist: Patient identified, Patient being monitored, Timeout performed, Emergency Drugs available and Suction available Patient Re-evaluated:Patient Re-evaluated prior to induction Oxygen Delivery Method: Circle system utilized Preoxygenation: Pre-oxygenation with 100% oxygen Induction Type: IV induction Ventilation: Mask ventilation without difficulty and Oral airway inserted - appropriate to patient size Laryngoscope Size: McGraph and 4 Grade View: Grade I Tube type: Oral Tube size: 7.5 mm Number of attempts: 1 Airway Equipment and Method: Stylet Placement Confirmation: ETT inserted through vocal cords under direct vision,  positive ETCO2 and breath sounds checked- equal and bilateral Secured at: 24 cm Tube secured with: Tape Dental Injury: Teeth and Oropharynx as per pre-operative assessment

## 2019-10-17 NOTE — Discharge Instructions (Signed)

## 2019-10-26 ENCOUNTER — Telehealth: Payer: Self-pay | Admitting: Urology

## 2019-10-26 NOTE — Telephone Encounter (Signed)
NO PA REQUIRED FOR LUPRON/ELIGARD CASE# [id number] WITH [employer] 10-25-19 MICHELLE

## 2019-10-27 ENCOUNTER — Telehealth: Payer: Self-pay | Admitting: Urology

## 2019-10-27 NOTE — Telephone Encounter (Signed)
-----   Message from Royanne Foots, [place] sent at 10/27/2019 10:19 AM EST ----- Diamantina [facility] says Eligard asap and follow up with him in 53mo w/PSA prior ----- Message ----- From: Benard Halsted Sent: 10/18/2019  10:44 AM EST To: Royanne Foots, CMA, Darl Householder  PA pending I will make his app once I get the PA back   Sharyn Lull ----- Message ----- From: Darl Householder Sent: 10/17/2019   9:34 AM EST To: Odette Fraction, CMA  Please schedule for lupron injection in the next 2-3 weeks, ok to overbook if needed, thanks  Nickolas Madrid, MD 10/17/2019

## 2019-10-27 NOTE — Telephone Encounter (Signed)
Left message for patient to cb to schedule his 1st Eligard injection ASAP on either [place] or [place] He will then need a 6 month follow up with Vinetta Bergamo

## 2019-10-31 ENCOUNTER — Other Ambulatory Visit: Payer: Self-pay

## 2019-10-31 ENCOUNTER — Ambulatory Visit: Payer: Managed Care, Other (non HMO) | Admitting: Urology

## 2019-10-31 VITALS — BP 146/94 | HR 73 | Ht 69.0 in | Wt 200.0 lb

## 2019-10-31 DIAGNOSIS — C61 Malignant neoplasm of prostate: Secondary | ICD-10-CM

## 2019-10-31 MED ORDER — TAMSULOSIN HCL 0.4 MG PO CAPS: 0.4000 mg | ORAL_CAPSULE | Freq: Every day | ORAL | 3 refills | Status: DC

## 2019-10-31 MED ORDER — LEUPROLIDE ACETATE (6 MONTH) 45 MG ~~LOC~~ KIT
45.0000 mg | PACK | Freq: Once | SUBCUTANEOUS | Status: AC
  Administered 2019-10-31: 45 mg via SUBCUTANEOUS

## 2019-10-31 NOTE — Progress Notes (Signed)
   10/31/2019 1:43 PM   ERMEL BOYCHUK 12/19/1960 [id number]  Reason for visit: ADT injection  HPI: Mr. Antonio [name] is a 58 year old male recently diagnosed with unfavorable intermediate risk prostate cancer who underwent brachytherapy seed placement on 10/17/2019.  He presents today for ADT.  A 55-month injection of Lupron was given today.  We a long conversation about the benefits and risks of ADT including hot flashes, decreased libido, erectile dysfunction, weight gain, fatigue, osteoporosis, depression, and possible increase in risk of cardiac events.  RTC 6 months with PSA prior Continue Flomax for urinary symptoms  A total of 15 minutes were spent face-to-face with the patient, greater than 50% was spent in patient education, counseling, and coordination of care regarding prostate cancer and ADT.  Billey Co, [facility] [address] [place], [place] [address] [phone]

## 2019-10-31 NOTE — Patient Instructions (Signed)
Hormone Suppression Therapy for Prostate Cancer    Hormone suppression therapy is a treatment that can help to slow the growth of cancer cells in the prostate (prostate gland). It is also called androgen deprivation therapy (ADT). Hormone suppression therapy targets hormones in the body called androgens that may help cancer cells grow. This therapy reduces the amount of these hormones in the body or keeps the body from using them.  Hormone suppression therapy alone will not cure prostate cancer, but it can slow the growth of prostate cancer and may shrink tumors over time. Your health care provider can help you find the most effective way to treat your type of prostate cancer and best fit your lifestyle.  Types of hormone suppression therapy  Orchiectomy  Orchiectomy, also called surgical castration, is a surgery to remove one or both testicles. The testicles are where the two main androgens (testosterone and dihydrotestosterone) are made.  Medicine therapy  Medicine therapy, also called chemical castration, involves taking medicines to keep your body from making or using androgens. If you choose this therapy, you may take any of these medicines:   Luteinizing hormone-releasing hormone (LHRH) agonist medicines. These medicines are injected or implanted under your skin to lower the amount of androgens that your testicles make. If you take these medicines, you may also be prescribed other medicines to help with side effects. Common LHRH agonist medicines include:  ? Leuprolide.  ? Goserelin.  ? Histrelin.  ? Triptorelin.   LHRH antagonist medicines. These medicines are like LHRH agonist medicines but they work faster. They are commonly used to prevent side effects when prostate cancer is in an advanced stage. A common LHRH antagonist medicine is degarelix.   CYP17 inhibitor medicines. These medicines help to stop other glands from making androgens. They may be used if the prostate cancer is advanced and has not  gotten better with surgery or other medicine treatment. A steroid medicine may be given with this type of medicine to help with side effects. A common CYP17 inhibitor medicine is abiraterone.    Anti-androgen medicines  Anti-androgen medicines block areas on the body where androgens attach. This prevents the androgens from coming into contact with cancer cells and fueling their growth. These medicines include:   Flutamide.   Bicalutamide.   Enzalutamide.   Nilutamide.  Androgen-suppressing medicines  Androgen-suppressing medicines may be used if other hormone suppression treatments are not working. They are not commonly used, however, because of their side effects. These medicines include:   Estrogen.   Ketoconazole.  What are the risks?  Hormone suppression therapy may cause side effects, including:   Hot flashes.   A decrease or lack of sexual desire.   Erectile dysfunction (impotence).   A decrease in the size of the penis or testicles.   Breast tenderness.   An increase in breast size.   Fatigue.   Weight gain.   Thinning of the bones (osteoporosis).   Anemia.   Loss of muscle.   Depression.   Increased cholesterol levels.   Trouble with thinking or focusing.   Stomach upset and nausea.   Diarrhea.  Hormone suppression therapy can also increase your risk of these problems:   High blood pressure (hypertension).   Stroke.   Diabetes.   Heart attack.   Heart disease.  What are the benefits?  One of the main benefits of hormone suppression therapy is having additional treatment options. You may have only one type of treatment or two or more types   side effects that do not get better  with treatment.  You have trouble urinating.  You have new side effects that do not go away. Get help right away if:  You have severe chest pain.  You have trouble breathing.  You have an irregular heartbeat.  You have numbness or paralysis in the lower half of your body.  You are confused.  You have trouble talking or understanding. These symptoms may be an emergency. Do not wait to see if the symptoms will go away. Get medical help right away. Call your local emergency services (911 in the [place] Do not drive yourself to the hospital. Summary  Hormone suppression therapy is a treatment that can help to slow the growth of cancer cells in the prostate (prostate gland).  Hormone suppression therapy alone will not cure prostate cancer, but it can slow the growth of prostate cancer and may shrink tumors over time.  Treatment to suppress hormones may include surgery or medicines. This information is not intended to replace advice given to you by your health care provider. Make sure you discuss any questions you have with your health care provider. Document Released: 10/15/2016 Document Revised: 11/21/2016 Document Reviewed: 10/15/2016 Elsevier Patient Education  2020 [employer].

## 2019-11-11 ENCOUNTER — Other Ambulatory Visit: Payer: Self-pay

## 2019-11-14 ENCOUNTER — Ambulatory Visit
Admission: RE | Admit: 2019-11-14 | Discharge: 2019-11-14 | Disposition: A | Discharge status: Home / Self Care | Payer: Managed Care, Other (non HMO) | Source: Ambulatory Visit | Attending: Radiation Oncology | Admitting: Radiation Oncology

## 2019-11-14 ENCOUNTER — Encounter: Payer: Self-pay | Admitting: Radiation Oncology

## 2019-11-14 ENCOUNTER — Other Ambulatory Visit: Payer: Self-pay

## 2019-11-14 VITALS — BP 127/77 | HR 68 | Temp 97.8°F | Resp 18 | Wt 205.5 lb

## 2019-11-14 DIAGNOSIS — Z923 Personal history of irradiation: Secondary | ICD-10-CM | POA: Diagnosis not present

## 2019-11-14 DIAGNOSIS — R197 Diarrhea, unspecified: Secondary | ICD-10-CM | POA: Insufficient documentation

## 2019-11-14 DIAGNOSIS — R3915 Urgency of urination: Secondary | ICD-10-CM | POA: Insufficient documentation

## 2019-11-14 DIAGNOSIS — R350 Frequency of micturition: Secondary | ICD-10-CM | POA: Insufficient documentation

## 2019-11-14 DIAGNOSIS — C61 Malignant neoplasm of prostate: Secondary | ICD-10-CM | POA: Diagnosis not present

## 2019-11-14 DIAGNOSIS — Z510 Encounter for antineoplastic radiation therapy: Secondary | ICD-10-CM | POA: Insufficient documentation

## 2019-11-14 NOTE — Progress Notes (Signed)
Radiation Oncology Follow up Note  Name: Antonio [name]   Date:   11/14/2019 MRN:  [id number] DOB: 08/26/1961    This 58 y.o. male presents to the clinic today for 1 month follow-up status post I-125 interstitial implant for Gleason 7 (3+4) adenocarcinoma the prostate presenting with a PSA of 6.5..  REFERRING PROVIDER: No ref. provider found  HPI: Patient is a 57 year old male now out 1 month having completed I-125 interstitial implant for stage II Gleason 7 adenocarcinoma the prostate presenting with a PSA of 6.5.  Seen today in routine follow-up he is doing fairly well still has some urgency and frequency of urination mostly at night he is currently on Flomax after dinner.  He had some mild intermittent diarrhea right after the implant that has subsided.  He had CT scan today for quality assurance of his seed placements and preliminary results look excellent.  COMPLICATIONS OF TREATMENT: none  FOLLOW UP COMPLIANCE: keeps appointments   PHYSICAL EXAM:  BP 127/77 (BP Location: Left Arm, Patient Position: Sitting)   Pulse 68   Temp 97.8 F (36.6 C) (Tympanic)   Resp 18   Wt 205 lb 8 oz (93.2 kg)   BMI 30.35 kg/m  Well-developed well-nourished patient in NAD. HEENT reveals PERLA, EOMI, discs not visualized.  Oral cavity is clear. No oral mucosal lesions are identified. Neck is clear without evidence of cervical or supraclavicular adenopathy. Lungs are clear to A&P. Cardiac examination is essentially unremarkable with regular rate and rhythm without murmur rub or thrill. Abdomen is benign with no organomegaly or masses noted. Motor sensory and DTR levels are equal and symmetric in the upper and lower extremities. Cranial nerves II through XII are grossly intact. Proprioception is intact. No peripheral adenopathy or edema is identified. No motor or sensory levels are noted. Crude visual fields are within normal range.  RADIOLOGY RESULTS: CT scan for source placement reviewed compatible with  above-stated findings  PLAN: Present time patient is doing well very low side effect profile 1 month out from I-125 interstitial implant.  I assured him the urgency and frequency will subside once he is no longer under active treatment.  I have asked to see him back in 3 to 4 months with a PSA prior to that visit.  Patient knows to call with any concerns.  I would like to take this opportunity to thank you for allowing me to participate in the care of your patient.Noreene Filbert, MD

## 2019-11-21 DIAGNOSIS — C61 Malignant neoplasm of prostate: Secondary | ICD-10-CM | POA: Diagnosis present

## 2019-11-21 DIAGNOSIS — Z510 Encounter for antineoplastic radiation therapy: Secondary | ICD-10-CM | POA: Diagnosis not present

## 2019-11-22 DIAGNOSIS — Z510 Encounter for antineoplastic radiation therapy: Secondary | ICD-10-CM | POA: Diagnosis not present

## 2019-12-21 DIAGNOSIS — C61 Malignant neoplasm of prostate: Secondary | ICD-10-CM | POA: Insufficient documentation

## 2019-12-28 ENCOUNTER — Telehealth: Payer: Self-pay

## 2019-12-28 NOTE — Telephone Encounter (Signed)
Telephone call to patient to discuss SCP visit.  No answer but left message on voice mail.  Waiting for return call.

## 2020-01-05 ENCOUNTER — Telehealth: Payer: Self-pay

## 2020-01-05 DIAGNOSIS — C61 Malignant neoplasm of prostate: Secondary | ICD-10-CM

## 2020-01-05 NOTE — Telephone Encounter (Signed)
Survivorship Care Plan visit completed.  Treatment summary reviewed and mailed to patient.  ASCO answers booklet reviewed and mailed to patient.  CARE program and Cancer Transitions discussed with patient along with other resources cancer center offers to patients and caregivers.  Patient verbalized understanding.  Pt in agreement for APP to call him in 2 weeks to discuss Survivorship Clinic.  Packet mailed.

## 2020-01-19 ENCOUNTER — Inpatient Hospital Stay: Payer: Managed Care, Other (non HMO) | Attending: Oncology | Admitting: Oncology

## 2020-01-19 ENCOUNTER — Other Ambulatory Visit: Payer: Self-pay

## 2020-01-19 DIAGNOSIS — C61 Malignant neoplasm of prostate: Secondary | ICD-10-CM | POA: Diagnosis not present

## 2020-01-19 NOTE — Progress Notes (Addendum)
Survivorship Clinic Consult Note [facility]  Telephone:([phone] [phone]  CLINIC:  Survivorship  REASON FOR VISIT:  Long-term survivorship surveillance visit for patient with history of Prsotate Cancer   I connected with [name] on 01/27/20 at  3:30 PM EST by telephone visit and verified that I am speaking with the correct person using two identifiers.   I discussed the limitations, risks, security and privacy concerns of performing an evaluation and management service by telemedicine and the availability of in-person appointments. I also discussed with the patient that there may be a patient responsible charge related to this service. The patient expressed understanding and agreed to proceed.   Other persons participating in the visit and their role in the encounter: None  Patient's location: Home Provider's location: Office  BRIEF ONCOLOGIC HISTORY:  Oncology History Overview Note  This 59 y.o. male status post I-125 interstitial implant for Gleason 7 (3+4) adenocarcinoma the prostate presenting with a PSA of 6.5.  Patient is a 59 year old male now out 1 month having completed I-125 interstitial implant for stage II Gleason 7 adenocarcinoma the prostate presenting with a PSA of 6.5.  Seen today in routine follow-up he is doing fairly well still has some urgency and frequency of urination mostly at night he is currently on Flomax after dinner.  He had some mild intermittent diarrhea right after the implant that has subsided.  He had CT scan today for quality assurance of his seed placements and preliminary results look excellent.   Prostate cancer ([place])  12/21/2019 Initial Diagnosis   Prostate cancer [facility])    INTERVAL HISTORY:  Patient is followed by Dr. Donella Stade and radiation oncology status post I-125 interstitial implant.   He has done well.  He was evaluated last in December 2020 with complaints of some urinary urgency and frequency  mostly at bedtime.  He continued Flomax.  He had intermittent mild diarrhea initially after implant but this has subsided.   Bone scan was negative for bone disease.  ADDITIONAL REVIEW OF SYSTEMS:  Review of Systems  Constitutional: Negative.  Negative for chills, fever, malaise/fatigue and weight loss.  HENT: Negative for congestion, ear pain and tinnitus.   Eyes: Negative.  Negative for blurred vision and double vision.  Respiratory: Negative.  Negative for cough, sputum production and shortness of breath.   Cardiovascular: Negative.  Negative for chest pain, palpitations and leg swelling.  Gastrointestinal: Negative.  Negative for abdominal pain, constipation, diarrhea, nausea and vomiting.  Genitourinary: Positive for frequency and urgency. Negative for dysuria.  Musculoskeletal: Negative for back pain and falls.  Skin: Negative.  Negative for rash.  Neurological: Negative.  Negative for weakness and headaches.  Endo/Heme/Allergies: Negative.  Does not bruise/bleed easily.  Psychiatric/Behavioral: Negative.  Negative for depression. The patient is not nervous/anxious and does not have insomnia.      PAST MEDICAL & SURGICAL HISTORY:  Past Medical History:  Diagnosis Date  . Aortic aneurysm ([place])   . Aortic insufficiency 07/12/2014   Overview:  Mild to moderate  . H/O bicuspid aortic valve 11/04/2013  . Heart palpitations 05/15/2017  . Hypertension   . Metabolic syndrome 99991111  . OSA (obstructive sleep apnea) 06/11/2018   no CPAP/None suggested  . Premature ventricular contractions 05/28/2017  . S/P aortic valve replacement with bioprosthetic valve 11/04/2013   Overview:  With carpentier-edwards bovine pericardial valve and aortic aneurysm repair 03/26/06 by Dr Ysidro Evert at [facility]   Past Surgical History:  Procedure Laterality Date  .  APPENDECTOMY    . CYSTOSCOPY N/A 10/17/2019   Procedure: CYSTOSCOPY;  Surgeon: Billey Co, MD;  Location: [facility] ORS;  Service: Urology;   Laterality: N/A;  . RADIOACTIVE SEED IMPLANT N/A 10/17/2019   Procedure: RADIOACTIVE SEED IMPLANT/BRACHYTHERAPY IMPLANT;  Surgeon: Billey Co, MD;  Location: [facility] ORS;  Service: Urology;  Laterality: N/A;  . valve replacement      SOCIAL HISTORY:  None   CURRENT MEDICATIONS:  Current Outpatient Medications on File Prior to Visit  Medication Sig Dispense Refill  . acetaminophen (TYLENOL) 500 MG tablet Take 1,000 mg by mouth every 6 (six) hours as needed (for pain.).    Marland Kitchen aspirin EC 81 MG tablet Take 81 mg by mouth daily.    Marland Kitchen atorvastatin (LIPITOR) 10 MG tablet Take 10 mg by mouth every evening.     . cetirizine (ZYRTEC) 10 MG tablet Take 10 mg by mouth daily.    . metoprolol succinate (TOPROL-XL) 50 MG 24 hr tablet Take 50 mg by mouth daily. Take with or immediately following a meal.    . Polyethyl Glycol-Propyl Glycol (LUBRICANT EYE DROPS) 0.4-0.3 % SOLN Place 1 drop into both eyes 3 (three) times daily as needed (dry/irritated eyes.).    Marland Kitchen tamsulosin (FLOMAX) 0.4 MG CAPS capsule Take 1 capsule (0.4 mg total) by mouth daily after supper. 90 capsule 3   No current facility-administered medications on file prior to visit.    ALLERGIES:  No Known Allergies  PHYSICAL EXAM:  Limited due to virtual platform  LABORATORY DATA:  Lab Results  Component Value Date   WBC 6.6 10/11/2019   HGB 13.9 10/11/2019   HCT 41.4 10/11/2019   MCV 89.8 10/11/2019   PLT 183 10/11/2019      Chemistry      Component Value Date/Time   CREATININE 1.20 08/26/2019 0909   No results found for: CALCIUM, ALKPHOS, AST, ALT, BILITOT    DIAGNOSTIC IMAGING:  Radioactive see implant/brachytherapy 10/17/19  Bone Scan   IMPRESSION: No definite scintigraphic evidence of osseous metastases  ASSESSMENT & PLAN:  Mr. Bushee is a pleasant 59 y.o. male who presented with a PSA of 6.5.  He is status post I 125 interstitial implant for Gleason 7. tolerated well with only minimal side effects.  Treatment  complete at the end of November 2020.  Patient presents to survivorship clinic today for survivorship care plan visit and to address any acute survivorship concerns since completing treatment.    1. History of prostate cancer: Clinically, he is without evidence of disease recurrence based on physical exam/diagnostic imaging.  Today, he received a copy of his survivorship care plan (SCP) document, which was reviewed with him in detail.  The SCP details his cancer treatment history and potential late/long-term side effects of those treatments.  We discussed the follow-up schedule he can anticipate with interval imaging for surveillance of his cancer.  I have also shared a copy of his treatment summary/SCP with his PCP.  Mr. Keath will return to the survivorship clinic as needed; he will return to [place] at [facility] for surveillance visit with Dr. Donella Stade.  2. Problem at visit: None  3. Smoking cessation: I commended Mr. Biggio continued efforts to remain tobacco-free.  We discussed that one of the most important risk reduction strategies in preventing cancer recurrence in lung cancer patients is smoking cessation.  He is committed to abstaining from tobacco.  4. Physical activity/Healthy eating: Getting adequate physical activity and maintaining a healthy diet as  a cancer survivor is important for overall wellness and reduces the risk of cancer recurrence. We discussed the CARE program which is a fitness program that is offered to cancer survivors free of charge.  We also reviewed the American Cancer Society's booklet with recommendations for nutrition and physical activity.    4. Health promotion/Cancer screening:  Mr. Yazell is reportedly up-to-date on his colonoscopy, pap smear, PSA tests, skin screenings, and vaccinations.  I encouraged him to talk with his PCP about arranging appropriate cancer screening tests, as appropriate.   5. Support services/Counseling: Mr. Sandahl was seen today  in in effort to address both the physical and social concerns of our cancer survivors at [facility] at [facility]. It is not uncommon for this period of the patient's cancer care trajectory to be one of many emotions and stressors.  I provided support today through active listening, validation of concerns, and expressive supportive counseling.  Mr. Noel was encouraged to take advantage of our support services programs and support groups to better cope in his new life as a cancer survivor after completing anti-cancer treatment.   NCCN Guidelines and Recommendations:     Dispo:  -RTC for follow-up with radiation oncology on 02/13/20.    A total of 30 minutes was spent in face-to-face care of this patient, with greater than 50% of that time spent in counseling and care coordination.    Rulon Abide, AGNP-C [place] [facility] [place] (office) 01/19/20 2:46 PM

## 2020-01-27 NOTE — Addendum Note (Signed)
Addended by: Faythe Casa E on: 01/27/2020 12:49 PM   Modules accepted: Level of Service

## 2020-02-07 ENCOUNTER — Encounter: Payer: Self-pay | Admitting: Radiation Oncology

## 2020-02-13 ENCOUNTER — Other Ambulatory Visit: Payer: Self-pay

## 2020-02-13 ENCOUNTER — Encounter: Payer: Self-pay | Admitting: Radiation Oncology

## 2020-02-13 ENCOUNTER — Ambulatory Visit
Admission: RE | Admit: 2020-02-13 | Discharge: 2020-02-13 | Disposition: A | Discharge status: Home / Self Care | Payer: Managed Care, Other (non HMO) | Source: Ambulatory Visit | Attending: Radiation Oncology | Admitting: Radiation Oncology

## 2020-02-13 VITALS — BP 105/70 | HR 73 | Temp 96.9°F | Resp 16 | Wt 211.0 lb

## 2020-02-13 DIAGNOSIS — R351 Nocturia: Secondary | ICD-10-CM | POA: Insufficient documentation

## 2020-02-13 DIAGNOSIS — C61 Malignant neoplasm of prostate: Secondary | ICD-10-CM | POA: Insufficient documentation

## 2020-02-13 DIAGNOSIS — Z923 Personal history of irradiation: Secondary | ICD-10-CM | POA: Insufficient documentation

## 2020-02-13 NOTE — Progress Notes (Signed)
Radiation Oncology Follow up Note  Name: Antonio [name]   Date:   02/13/2020 MRN:  [id number] DOB: 1961/01/04    This 59 y.o. male presents to the clinic today for 58-month follow-up status post I-125 interstitial implant for Gleason 7 (3+4) adenocarcinoma presenting with a PSA of 6.5.  REFERRING PROVIDER: No ref. provider found  HPI: Patient is a 59 year old male now out 4 months having completed I-125 interstitial implant for Gleason 7 adenocarcinoma the prostate seen today in routine follow-up his major complaint is lower urinary tract symptoms such as frequency urgency nocturia x4-5.  He is currently on Flomax after his evening meal.  His most recent PSA is less than 0.1.  He specifically denies any diarrhea or GI complaints..  COMPLICATIONS OF TREATMENT: none  FOLLOW UP COMPLIANCE: keeps appointments   PHYSICAL EXAM:  BP 105/70   Pulse 73   Temp (!) 96.9 F (36.1 C) (Tympanic)   Resp 16   Wt 211 lb (95.7 kg)   BMI 31.16 kg/m  Well-developed well-nourished patient in NAD. HEENT reveals PERLA, EOMI, discs not visualized.  Oral cavity is clear. No oral mucosal lesions are identified. Neck is clear without evidence of cervical or supraclavicular adenopathy. Lungs are clear to A&P. Cardiac examination is essentially unremarkable with regular rate and rhythm without murmur rub or thrill. Abdomen is benign with no organomegaly or masses noted. Motor sensory and DTR levels are equal and symmetric in the upper and lower extremities. Cranial nerves II through XII are grossly intact. Proprioception is intact. No peripheral adenopathy or edema is identified. No motor or sensory levels are noted. Crude visual fields are within normal range.  RADIOLOGY RESULTS: No current films to review  PLAN: Present time patient is doing well under good biochemical control of his prostate cancer.  I have asked intake appointment with Dr. Jeb Levering to discuss some of his lower urinary tract symptoms.  I have assured  him over time this will improve.  I have asked to see him back in 6 months for follow-up with a PSA at that time.  Patient knows to call with any concerns.  I would like to take this opportunity to thank you for allowing me to participate in the care of your patient.Noreene Filbert, MD

## 2020-04-17 ENCOUNTER — Other Ambulatory Visit: Payer: Managed Care, Other (non HMO)

## 2020-04-17 ENCOUNTER — Other Ambulatory Visit: Payer: Self-pay

## 2020-04-17 DIAGNOSIS — C61 Malignant neoplasm of prostate: Secondary | ICD-10-CM

## 2020-04-18 LAB — PSA: Prostate Specific Ag, Serum: <0.1 ng/mL (ref 0.0–4.0)

## 2020-04-19 ENCOUNTER — Other Ambulatory Visit: Payer: Managed Care, Other (non HMO)

## 2020-04-26 ENCOUNTER — Ambulatory Visit: Payer: Managed Care, Other (non HMO) | Admitting: Urology

## 2020-04-26 ENCOUNTER — Other Ambulatory Visit: Payer: Self-pay

## 2020-04-26 ENCOUNTER — Encounter: Payer: Self-pay | Admitting: Urology

## 2020-04-26 VITALS — BP 106/70 | HR 65 | Ht 69.0 in | Wt 201.0 lb

## 2020-04-26 DIAGNOSIS — C61 Malignant neoplasm of prostate: Secondary | ICD-10-CM | POA: Diagnosis not present

## 2020-04-26 DIAGNOSIS — N401 Enlarged prostate with lower urinary tract symptoms: Secondary | ICD-10-CM | POA: Diagnosis not present

## 2020-04-26 DIAGNOSIS — N138 Other obstructive and reflux uropathy: Secondary | ICD-10-CM | POA: Diagnosis not present

## 2020-04-26 DIAGNOSIS — N529 Male erectile dysfunction, unspecified: Secondary | ICD-10-CM

## 2020-04-26 LAB — BLADDER SCAN AMB NON-IMAGING

## 2020-04-26 MED ORDER — TADALAFIL 5 MG PO TABS: 5.0000 mg | ORAL_TABLET | ORAL | 6 refills | Status: DC

## 2020-04-26 NOTE — Progress Notes (Signed)
° °  04/26/2020 2:43 PM   Antonio [name] [birth year] [id number]  Reason for visit: Follow up prostate cancer treated with brachytherapy  HPI: I saw [name] back in urology clinic for follow-up of prostate cancer.  He is a 59 year old healthy male who was diagnosed with unfavorable intermediate risk prostate cancer in August 2020.  PSA was elevated at 6.5 and biopsy showed a 29 g prostate, PSA density of 0.22, and 5/12 cores positive for Gleason score 4+3 equal 7 prostate cancer with 80% max core involvement.  He opted for brachytherapy and 6 months of ADT, and underwent seed implant on 10/17/2019.  Of note, at the time of his volume study he had significant pulsatile urethral bleeding with catheter placement that ultimately stopped with pressure.  Most recent PSA on 04/17/2020 is undetectable at <0.1.  He has persistently bothersome urinary symptoms since seed placement with some straining with urination and weak stream, urgency and frequency, and primarily nocturia 3-4 times per night that is very bothersome.  He feels like his urinary symptoms are worse at night.  He is on Flomax nightly.  IPSS score today is 31, with quality of life unhappy.  PVR is normal at 17 mL.  He is also having erectile dysfunction and has not had any spontaneous erections since brachytherapy implant.  We discussed his urinary symptoms at length today.  I recommended a cystoscopy to evaluate for any stricture in the setting of his significant bleeding with volume study as well as history of brachytherapy.  If cystoscopy is negative, could consider increasing Flomax dose or adding an anticholinergic or Myrbetriq.  He is also interested in a trial of Cialis for his ED, and we discussed risks and benefits of this medication at length.  Trial of Cialis 5 mg for ED RTC for cystoscopy   Billey Co, MD  [place] [address] [place], [place] [address] [phone]

## 2020-04-26 NOTE — Patient Instructions (Signed)
Tadalafil tablets (Cialis) What is this medicine? TADALAFIL (tah DA la fil) is used to treat erection problems in men. It is also used for enlargement of the prostate gland in men, a condition called benign prostatic hyperplasia or BPH. This medicine improves urine flow and reduces BPH symptoms. This medicine can also treat both erection problems and BPH when they occur together. This medicine may be used for other purposes; ask your health care provider or pharmacist if you have questions. COMMON BRAND NAME(S): Adcirca, ALYQ, Cialis What should I tell my health care provider before I take this medicine? They need to know if you have any of these conditions:  bleeding disorders  eye or vision problems, including a rare inherited eye disease called retinitis pigmentosa  anatomical deformation of the penis, Peyronie's disease, or history of priapism (painful and prolonged erection)  heart disease, angina, a history of heart attack, irregular heart beats, or other heart problems  high or low blood pressure  history of blood diseases, like sickle cell anemia or leukemia  history of stomach bleeding  kidney disease  liver disease  stroke  an unusual or allergic reaction to tadalafil, other medicines, foods, dyes, or preservatives  pregnant or trying to get pregnant  breast-feeding How should I use this medicine? Take this medicine by mouth with a glass of water. Follow the directions on the prescription label. You may take this medicine with or without meals. When this medicine is used for erection problems, your doctor may prescribe it to be taken once daily or as needed. If you are taking the medicine as needed, you may be able to have sexual activity 30 minutes after taking it and for up to 36 hours after taking it. Whether you are taking the medicine as needed or once daily, you should not take more than one dose per day. If you are taking this medicine for symptoms of benign  prostatic hyperplasia (BPH) or to treat both BPH and an erection problem, take the dose once daily at about the same time each day. Do not take your medicine more often than directed. Talk to your pediatrician regarding the use of this medicine in children. Special care may be needed. Overdosage: If you think you have taken too much of this medicine contact a poison control center or emergency room at once. NOTE: This medicine is only for you. Do not share this medicine with others. What if I miss a dose? If you are taking this medicine as needed for erection problems, this does not apply. If you miss a dose while taking this medicine once daily for an erection problem, benign prostatic hyperplasia, or both, take it as soon as you remember, but do not take more than one dose per day. What may interact with this medicine? Do not take this medicine with any of the following medications:  nitrates like amyl nitrite, isosorbide dinitrate, isosorbide mononitrate, nitroglycerin  other medicines for erectile dysfunction like avanafil, sildenafil, vardenafil  other tadalafil products (Adcirca)  riociguat This medicine may also interact with the following medications:  certain drugs for high blood pressure  certain drugs for the treatment of HIV infection or AIDS  certain drugs used for fungal or yeast infections, like fluconazole, itraconazole, ketoconazole, and voriconazole  certain drugs used for seizures like carbamazepine, phenytoin, and phenobarbital  grapefruit juice  macrolide antibiotics like clarithromycin, erythromycin, troleandomycin  medicines for prostate problems  rifabutin, rifampin or rifapentine This list may not describe all possible interactions. Give your   health care provider a list of all the medicines, herbs, non-prescription drugs, or dietary supplements you use. Also tell them if you smoke, drink alcohol, or use illegal drugs. Some items may interact with your  medicine. What should I watch for while using this medicine? If you notice any changes in your vision while taking this drug, call your doctor or health care professional as soon as possible. Stop using this medicine and call your health care provider right away if you have a loss of sight in one or both eyes. Contact your doctor or health care professional right away if the erection lasts longer than 4 hours or if it becomes painful. This may be a sign of serious problem and must be treated right away to prevent permanent damage. If you experience symptoms of nausea, dizziness, chest pain or arm pain upon initiation of sexual activity after taking this medicine, you should refrain from further activity and call your doctor or health care professional as soon as possible. Do not drink alcohol to excess (examples, 5 glasses of wine or 5 shots of whiskey) when taking this medicine. When taken in excess, alcohol can increase your chances of getting a headache or getting dizzy, increasing your heart rate or lowering your blood pressure. Using this medicine does not protect you or your partner against HIV infection (the virus that causes AIDS) or other sexually transmitted diseases. What side effects may I notice from receiving this medicine? Side effects that you should report to your doctor or health care professional as soon as possible:  allergic reactions like skin rash, itching or hives, swelling of the face, lips, or tongue  breathing problems  changes in hearing  changes in vision  chest pain  fast, irregular heartbeat  prolonged or painful erection  seizures Side effects that usually do not require medical attention (report to your doctor or health care professional if they continue or are bothersome):  back pain  dizziness  flushing  headache  indigestion  muscle aches  nausea  stuffy or runny nose This list may not describe all possible side effects. Call your doctor  for medical advice about side effects. You may report side effects to FDA at 1-800-FDA-1088. Where should I keep my medicine? Keep out of the reach of children. Store at room temperature between 15 and 30 degrees C (59 and 86 degrees F). Throw away any unused medicine after the expiration date. NOTE: This sheet is a summary. It may not cover all possible information. If you have questions about this medicine, talk to your doctor, pharmacist, or health care provider.  2020 Elsevier/Gold Standard (2014-03-31 13:15:49)  

## 2020-05-10 ENCOUNTER — Encounter: Payer: Self-pay | Admitting: Urology

## 2020-05-10 ENCOUNTER — Other Ambulatory Visit: Payer: Self-pay

## 2020-05-10 ENCOUNTER — Ambulatory Visit: Payer: Managed Care, Other (non HMO) | Admitting: Urology

## 2020-05-10 VITALS — BP 99/65 | HR 62 | Ht 69.0 in | Wt 196.0 lb

## 2020-05-10 DIAGNOSIS — N3281 Overactive bladder: Secondary | ICD-10-CM

## 2020-05-10 DIAGNOSIS — C61 Malignant neoplasm of prostate: Secondary | ICD-10-CM | POA: Diagnosis not present

## 2020-05-10 NOTE — Progress Notes (Signed)
Cystoscopy Procedure Note:  Indication: Urinary symptoms after brachytherapy  UA benign  After informed consent and discussion of the procedure and its risks, Antonio [name] was positioned and prepped in the standard fashion. Cystoscopy was performed with a flexible cystoscope. The urethra, bladder neck and entire bladder was visualized in a standard fashion. The prostate was small. The ureteral orifices were visualized in their normal location and orientation. No abnormalities on retroflexion.  Findings: Normal cystoscopy  Assessment and Plan: Reassurance provided, suspect his urinary symptoms are secondary to radiation will continue to improve.  Emptying his bladder well with low PVR, urinalysis is benign, symptoms have continued to improve over the last week.  -Continue flomax and cialis -Samples of mybetriq given -Virtual visit 4-6 weeks -RTC 6 months with PSA prior  Nickolas Madrid, MD 05/10/2020

## 2020-05-14 LAB — MICROSCOPIC EXAMINATION
Bacteria, UA: NONE SEEN
Epithelial Cells (non renal): NONE SEEN /hpf (ref 0–10)

## 2020-05-14 LAB — URINALYSIS, COMPLETE
Bilirubin, UA: NEGATIVE
Glucose, UA: NEGATIVE
Leukocytes,UA: NEGATIVE
Nitrite, UA: NEGATIVE
Protein,UA: NEGATIVE
Specific Gravity, UA: >1.03 — ABNORMAL HIGH (ref 1.005–1.030)
Urobilinogen, Ur: 0.2 mg/dL (ref 0.2–1.0)
pH, UA: 5 (ref 5.0–7.5)

## 2020-06-21 ENCOUNTER — Other Ambulatory Visit: Payer: Self-pay

## 2020-06-21 ENCOUNTER — Telehealth (INDEPENDENT_AMBULATORY_CARE_PROVIDER_SITE_OTHER): Payer: Managed Care, Other (non HMO) | Admitting: Urology

## 2020-06-21 DIAGNOSIS — C61 Malignant neoplasm of prostate: Secondary | ICD-10-CM

## 2020-06-21 MED ORDER — OXYBUTYNIN CHLORIDE ER 10 MG PO TB24: 10.0000 mg | ORAL_TABLET | Freq: Every day | ORAL | 2 refills | Status: DC

## 2020-06-21 NOTE — Progress Notes (Addendum)
Virtual Visit via Telephone Note  I connected with Antonio [name] on 06/21/20 at  4:00 PM EDT by telephone and verified that I am speaking with the correct person using two identifiers.   Patient location: Home Provider location: [facility]  I discussed the limitations, risks, security and privacy concerns of performing an evaluation and management service by telephone and the availability of in person appointments. We discussed the impact of the COVID-19 pandemic on the healthcare system, and the importance of social distancing and reducing patient and provider exposure. I also discussed with the patient that there may be a patient responsible charge related to this service. The patient expressed understanding and agreed to proceed.  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  Cystoscopy on 05/10/2020 was benign, and he was started on Flomax, Cialis, and samples of Myrbetriq.  He reports his urinary symptoms continue to improve week by week, and he really has no complaints today.  He is interested in continuing a overactive bladder medication, and I recommended trying oxybutynin secondary to cost over the Myrbetriq.  We discussed return precautions at length  Assessment and Plan: Continue Flomax and Cialis, trial of oxybutynin  Follow Up: Follow-up in 5 months with PSA prior, sooner if worsening urinary symptoms   I discussed the assessment and treatment plan with the patient. The patient was provided an opportunity to ask questions and all were answered. The patient agreed with the plan and demonstrated an understanding of the instructions.   The patient was advised to call back or seek an in-person evaluation if the symptoms worsen or if  the condition fails to improve as anticipated.  I provided 12 minutes of non-face-to-face time during this encounter.   Billey Co, MD

## 2020-08-15 ENCOUNTER — Encounter: Payer: Self-pay | Admitting: Radiation Oncology

## 2020-08-15 ENCOUNTER — Ambulatory Visit
Admission: RE | Admit: 2020-08-15 | Discharge: 2020-08-15 | Disposition: A | Discharge status: Home / Self Care | Payer: Managed Care, Other (non HMO) | Source: Ambulatory Visit | Attending: Radiation Oncology | Admitting: Radiation Oncology

## 2020-08-15 ENCOUNTER — Other Ambulatory Visit: Payer: Self-pay

## 2020-08-15 VITALS — BP 112/71 | HR 60 | Temp 96.1°F | Resp 16 | Wt 191.4 lb

## 2020-08-15 DIAGNOSIS — Z923 Personal history of irradiation: Secondary | ICD-10-CM | POA: Insufficient documentation

## 2020-08-15 DIAGNOSIS — C61 Malignant neoplasm of prostate: Secondary | ICD-10-CM | POA: Insufficient documentation

## 2020-08-15 NOTE — Progress Notes (Signed)
Radiation Oncology Follow up Note  Name: Antonio [name]   Date:   08/15/2020 MRN:  [id number] DOB: [birth year]    This 59 y.o. male presents to the clinic today for 20-month follow-up status post I-125 interstitial implant for Gleason 7 (3+4) adenocarcinoma the prostate presenting with a PSA of 6.5.  REFERRING PROVIDER: No ref. provider found  HPI: Patient is a 59 year old male now out 10 months having completed I-125 interstitial implant for Gleason 7 (3+4) adenocarcinoma the prostate seen today in routine follow-up he is doing well.  He specifically denies any increased lower urinary tract symptoms diarrhea or fatigue most recent PSA is less than 0.1 performed at [facility].  COMPLICATIONS OF TREATMENT: none  FOLLOW UP COMPLIANCE: keeps appointments   PHYSICAL EXAM:  BP 112/71 (BP Location: Left Arm, Patient Position: Sitting)   Pulse 60   Temp (!) 96.1 F (35.6 C)   Resp 16   Wt 191 lb 6.4 oz (86.8 kg)   BMI 28.26 kg/m  Well-developed well-nourished patient in NAD. HEENT reveals PERLA, EOMI, discs not visualized.  Oral cavity is clear. No oral mucosal lesions are identified. Neck is clear without evidence of cervical or supraclavicular adenopathy. Lungs are clear to A&P. Cardiac examination is essentially unremarkable with regular rate and rhythm without murmur rub or thrill. Abdomen is benign with no organomegaly or masses noted. Motor sensory and DTR levels are equal and symmetric in the upper and lower extremities. Cranial nerves II through XII are grossly intact. Proprioception is intact. No peripheral adenopathy or edema is identified. No motor or sensory levels are noted. Crude visual fields are within normal range.  RADIOLOGY RESULTS: No current films to review  PLAN: Present time patient is doing well under excellent biochemical control of his prostate cancer.  I am pleased with his overall progress.  I have asked to see him back i in 6 months for follow-up.  Pleased with his low  side effect profile overall excellent biochemical control of his disease.  Patient knows to call with any concerns.  I would like to take this opportunity to thank you for allowing me to participate in the care of your patient.Noreene Filbert, MD

## 2020-09-17 ENCOUNTER — Other Ambulatory Visit: Payer: Self-pay | Admitting: Urology

## 2020-10-12 ENCOUNTER — Other Ambulatory Visit: Payer: Self-pay | Admitting: Urology

## 2020-11-08 ENCOUNTER — Ambulatory Visit: Payer: Managed Care, Other (non HMO) | Admitting: Urology

## 2020-11-14 ENCOUNTER — Ambulatory Visit: Payer: Managed Care, Other (non HMO) | Admitting: Urology

## 2020-11-28 ENCOUNTER — Encounter: Payer: Self-pay | Admitting: Urology

## 2020-11-28 ENCOUNTER — Ambulatory Visit (INDEPENDENT_AMBULATORY_CARE_PROVIDER_SITE_OTHER): Payer: Managed Care, Other (non HMO) | Admitting: Urology

## 2020-11-28 ENCOUNTER — Other Ambulatory Visit: Payer: Self-pay

## 2020-11-28 VITALS — BP 108/69 | HR 60 | Ht 69.0 in | Wt 202.0 lb

## 2020-11-28 DIAGNOSIS — N401 Enlarged prostate with lower urinary tract symptoms: Secondary | ICD-10-CM | POA: Diagnosis not present

## 2020-11-28 DIAGNOSIS — C61 Malignant neoplasm of prostate: Secondary | ICD-10-CM

## 2020-11-28 DIAGNOSIS — N529 Male erectile dysfunction, unspecified: Secondary | ICD-10-CM | POA: Diagnosis not present

## 2020-11-28 DIAGNOSIS — N138 Other obstructive and reflux uropathy: Secondary | ICD-10-CM

## 2020-11-28 LAB — BLADDER SCAN AMB NON-IMAGING

## 2020-11-28 MED ORDER — TAMSULOSIN HCL 0.4 MG PO CAPS: 0.4000 mg | ORAL_CAPSULE | Freq: Every day | ORAL | 3 refills | Status: DC

## 2020-11-28 MED ORDER — TADALAFIL 5 MG PO TABS: 5.0000 mg | ORAL_TABLET | Freq: Every day | ORAL | 9 refills | Status: DC | PRN

## 2020-11-28 MED ORDER — MIRABEGRON ER 50 MG PO TB24: 50.0000 mg | ORAL_TABLET | Freq: Every day | ORAL | 0 refills | Status: DC

## 2020-11-28 NOTE — Progress Notes (Signed)
   11/28/2020 3:29 PM   Antonio [name] [birth year] [id number]  Reason for visit: Follow up prostate cancer, urinary symptoms, ED  HPI: I saw [name] back in urology clinic for follow-up of intermediate risk prostate cancer that was treated with brachytherapy.  He had significant urinary symptoms afterwards of urgency, frequency, nocturia.  Cystoscopy on 05/10/2020 was benign.  Prostate is 29 g.  At that time he was started on Flomax, Cialis, and Myrbetriq.  We ultimately transitioned over to oxybutynin based on cost.  Most recent PSA in December 2021 was <0.1 ([facility]).  Overall, he is doing well.  He feels his urinary symptoms have continued to improve over the last 6 months.  His primary complaint with urination is weak stream, especially overnight, as well as some frequency.  He does not have any urinary incontinence.  The urgency is also improving.  He takes Flomax 0.4 mg nightly which she thinks makes a big difference.  IPSS score today is 22, with quality of life mixed.  PVR is normal at 0 mL.  He is actually taking the oxybutynin only as needed when he travels to help with frequency.  He has bothersome dry mouth from the oxybutynin, and felt that Myrbetriq helped more.  We reviewed UroLift as a possible option in the future if he continues to have bothersome urinary symptoms over the next 6 to 12 months that do not improve.  I think this would be a low risk of incontinence with his history of brachytherapy.  Regarding erections, he is having moderate results with the Cialis 5 mg every other day.  We discussed that he can increase this to 10 to 20 mg as well, and risks and benefits were discussed.  Okay to increase Cialis to 10 to 20 mg Flomax refilled Samples of Myrbetriq given for as needed use RTC 9 months with PSA prior([facility])  Sondra Come, MD  [facility] [address] [place], [place] [address] [phone]

## 2021-01-04 IMAGING — NM NM BONE WHOLE BODY
2 series · 6 of 6 positions shown · non-contrast
Comparison: None.

CLINICAL DATA: Prostate cancer.

EXAM:
NUCLEAR MEDICINE WHOLE BODY BONE SCAN
TECHNIQUE: Whole body anterior and posterior images were obtained approximately
3 hours after intravenous injection of radiopharmaceutical.
RADIOPHARMACEUTICALS:  21.327 mCi 7echnetium-WWm MDP IV

[Series 1000: 3 hr wholebody · 2.40mm/px · 2 of 2 frames shown]
[frame 1/2]
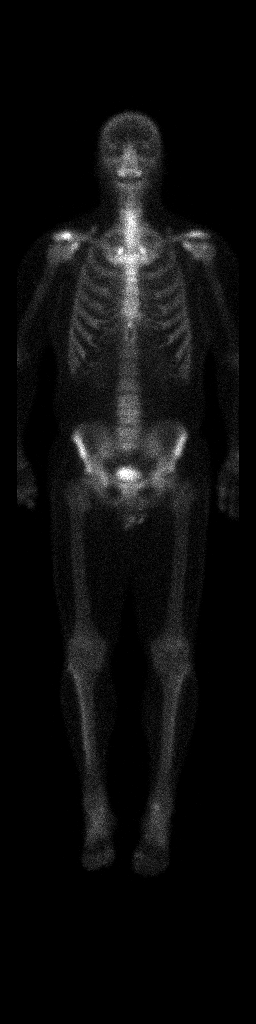
[frame 2/2]
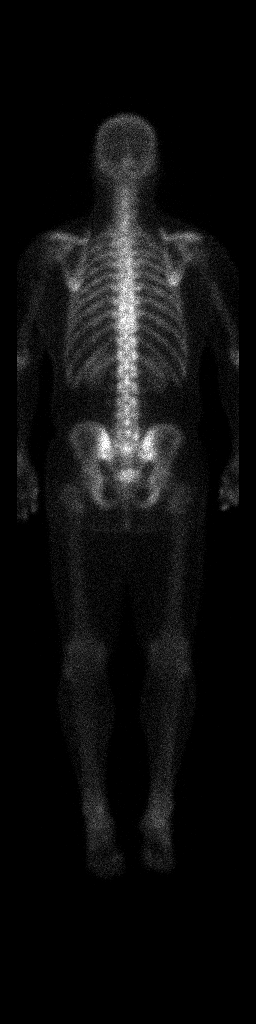

[Series 1000: statics · 2.40mm/px · 2 acquisitions, 4 frames shown]
[im 1/2]
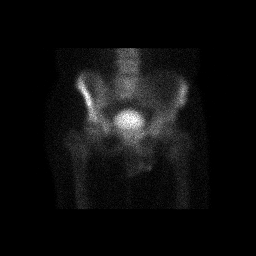
[im 1/2]
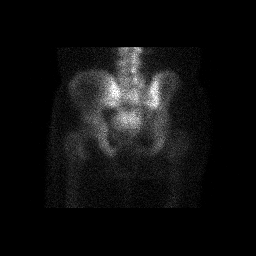
[im 2/2]
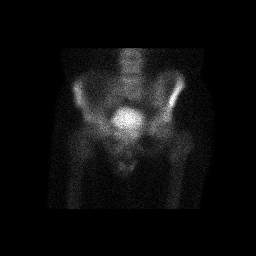
[im 2/2]
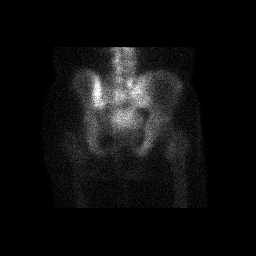

[6 of 6 positions shown; findings below may reference images not displayed]

FINDINGS: Symmetric abnormal uptake is seen involving both shoulders
consistent with degenerative change. No other areas of abnormal
uptake are noted.
IMPRESSION: No definite scintigraphic evidence of osseous metastases.

## 2021-01-04 IMAGING — CT CT ABD-PELV W/ CM
2 of 5 series · 16 of 46 positions shown, 18 images · IV contrast (APPLIED)
Comparison: None.

CLINICAL DATA: Newly diagnosed high risk prostate cancer

EXAM:
CT ABDOMEN AND PELVIS WITH CONTRAST
TECHNIQUE: Multidetector CT imaging of the abdomen and pelvis was performed
using the standard protocol following bolus administration of
intravenous contrast.
CONTRAST:  100mL OMNIPAQUE IOHEXOL 300 MG/ML  SOLN

[Series 2: routine abd/pel with · axial · 0.76mm/px · z∈[-806,-326]mm · 13 of 108 slices shown, 15 images]
[im 6/108  soft-tissue]
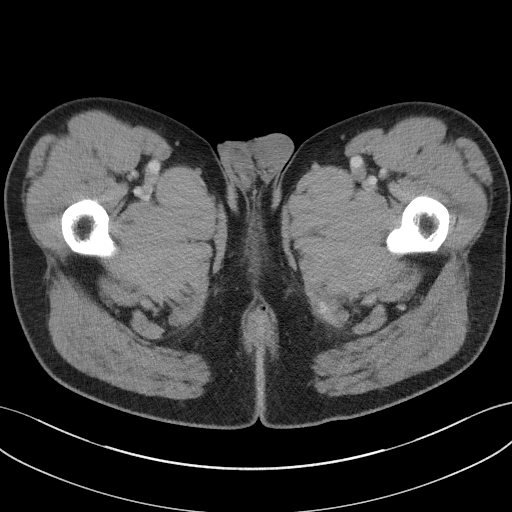
[im 6/108  bone]
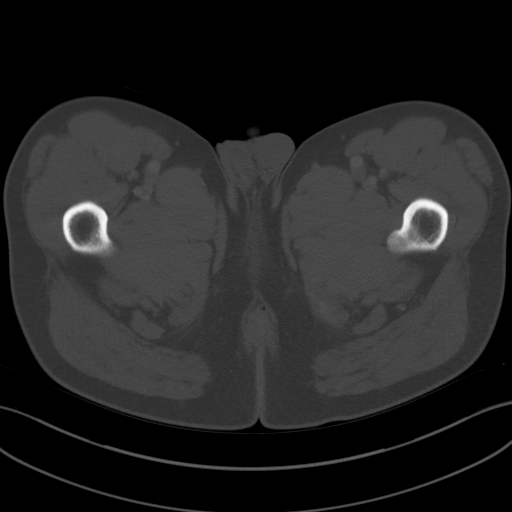
[im 17/108  soft-tissue]
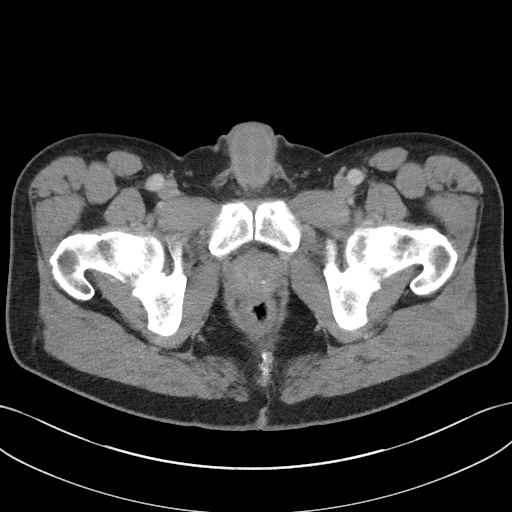
[im 23/108  soft-tissue]
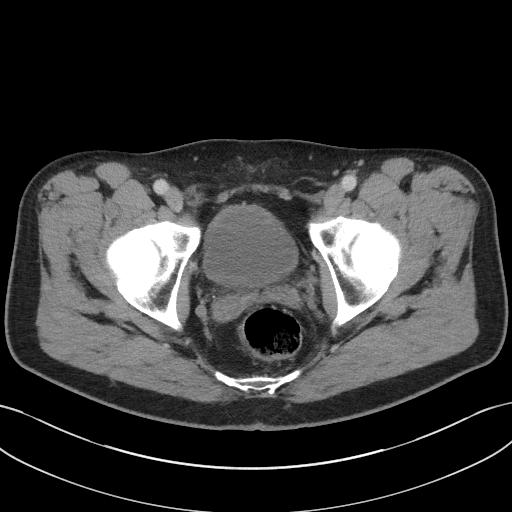
[im 29/108  soft-tissue]
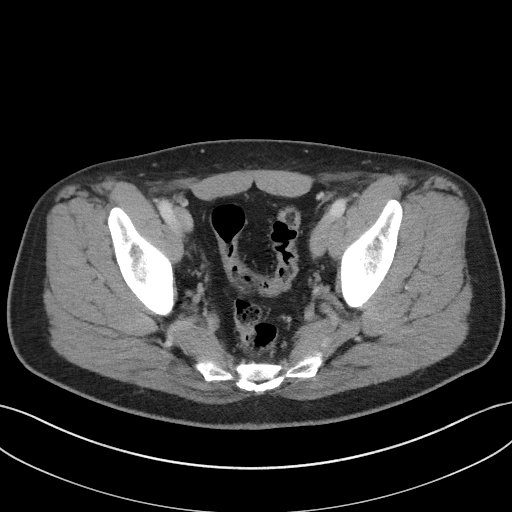
[im 40/108  soft-tissue]
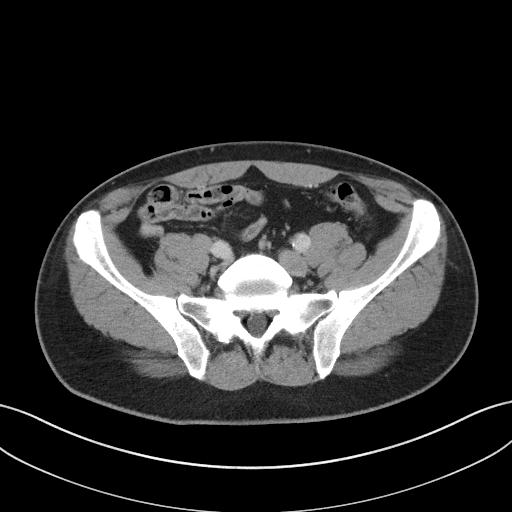
[im 46/108  soft-tissue]
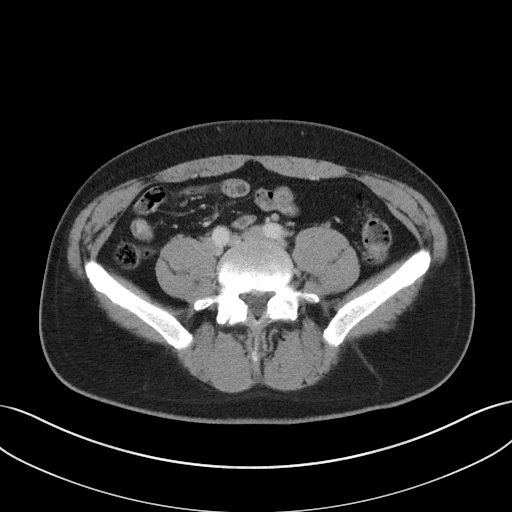
[im 57/108  soft-tissue]
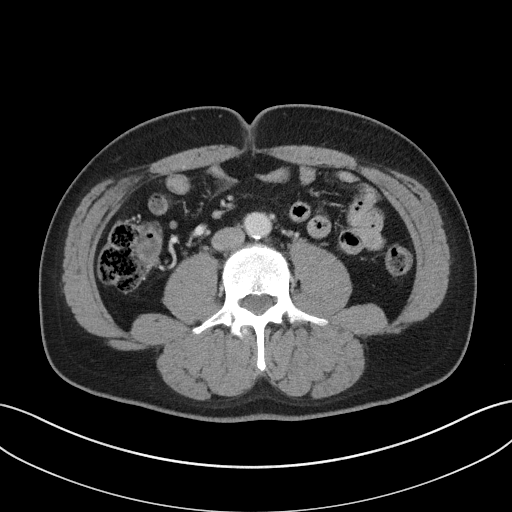
[im 62/108  soft-tissue]
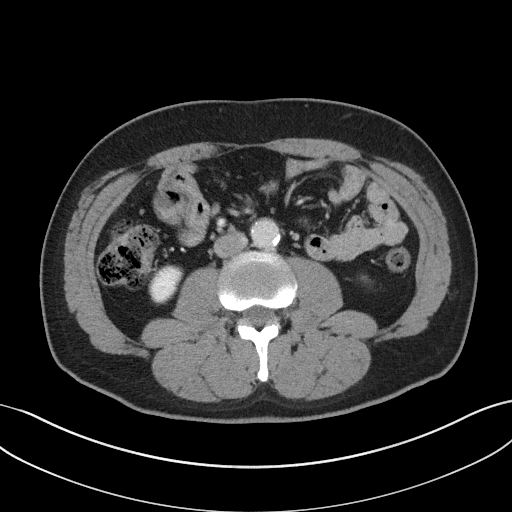
[im 68/108  soft-tissue]
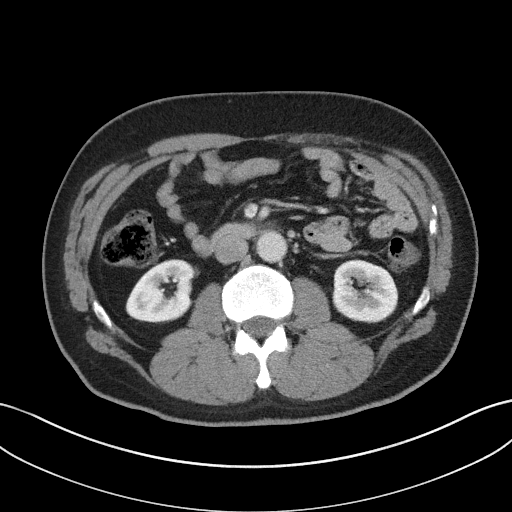
[im 68/108  bone]
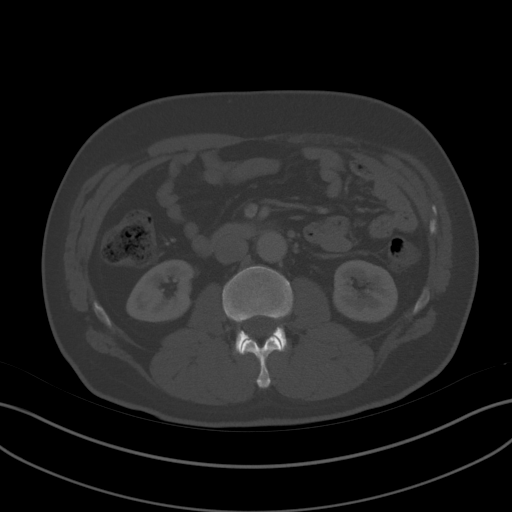
[im 79/108  soft-tissue]
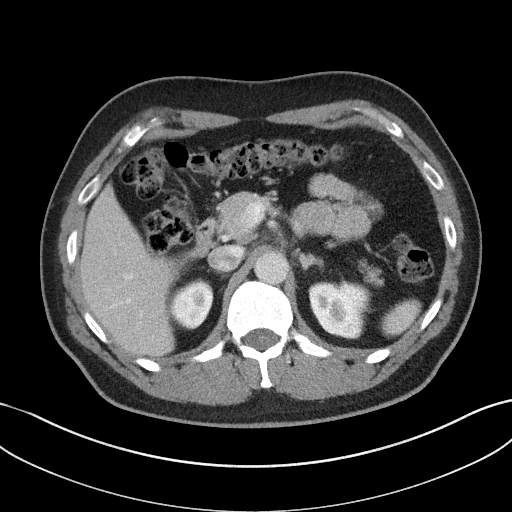
[im 85/108  soft-tissue]
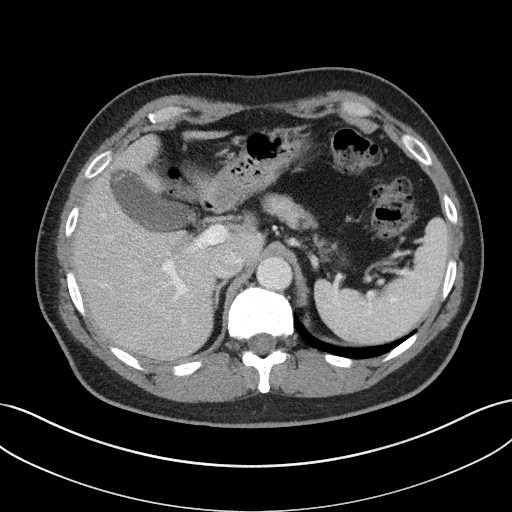
[im 91/108  soft-tissue]
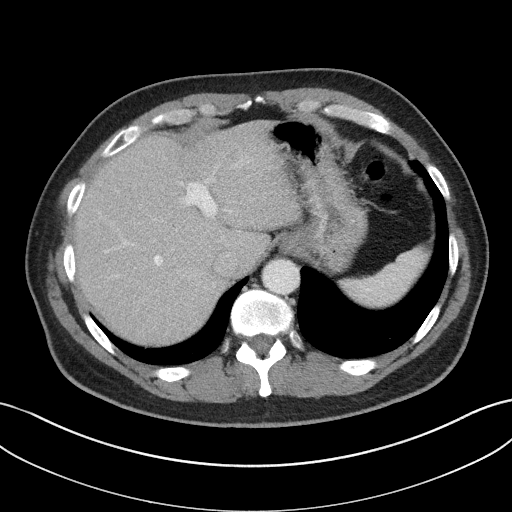
[im 102/108  soft-tissue]
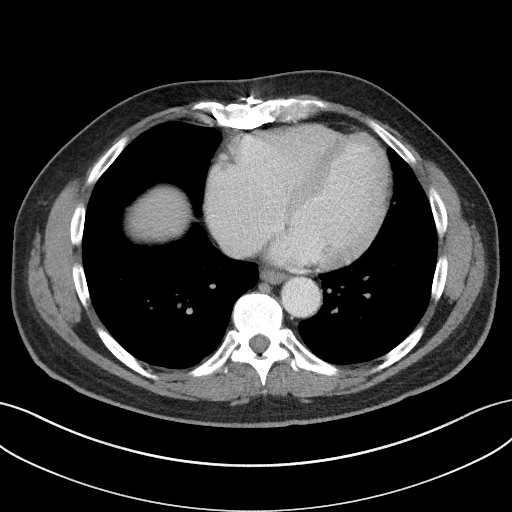

[Series 5: coronal st · coronal · 0.83mm/px · 3 of 91 slices shown]
[im 31/91  soft-tissue]
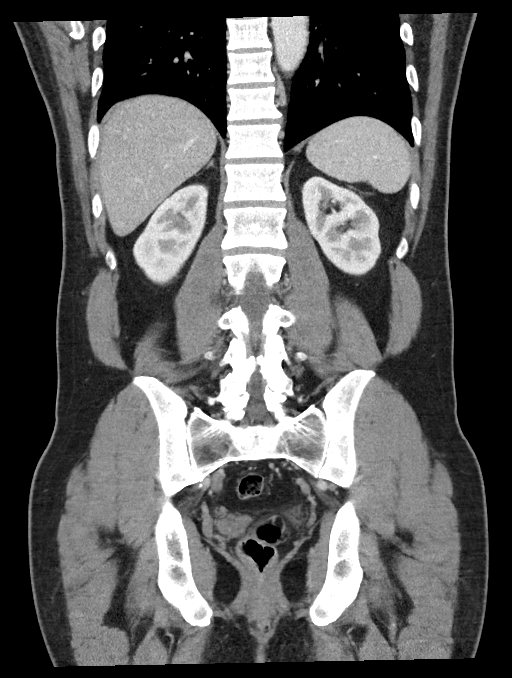
[im 41/91  soft-tissue]
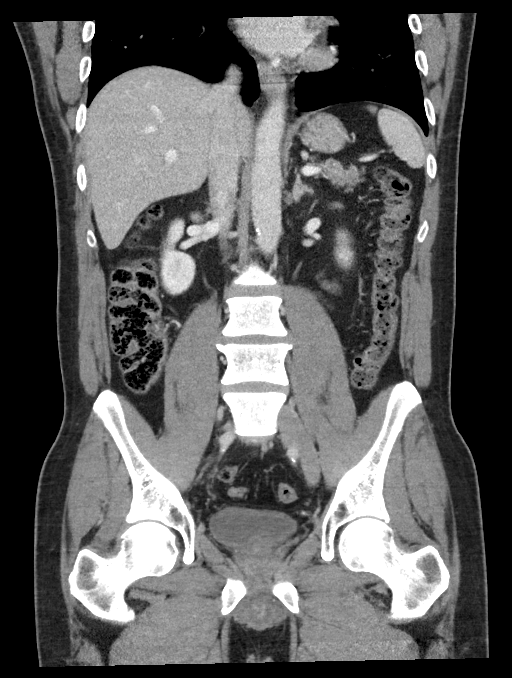
[im 51/91  soft-tissue]
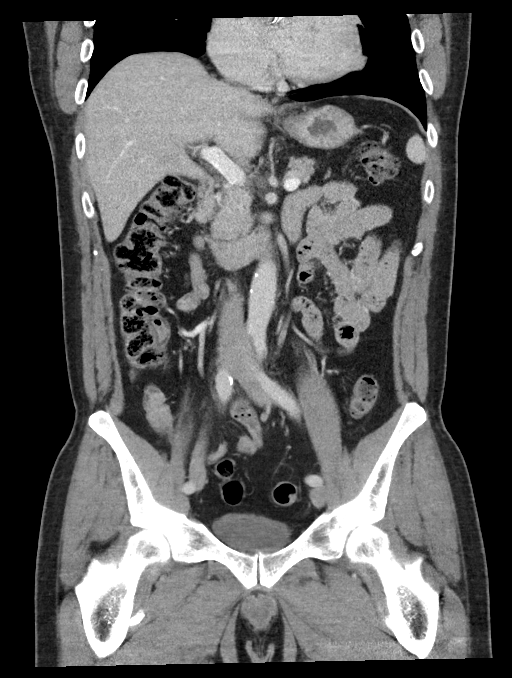

[16 of 46 positions shown; findings below may reference images not displayed]

FINDINGS: Lower chest: Lung bases are clear.

Hepatobiliary: Liver is within normal limits.

Gallbladder is unremarkable. No intrahepatic or extrahepatic duct
dilatation.

Pancreas: Within normal limits.

Spleen: Within normal limits.

Adrenals/Urinary Tract: Adrenal glands are within normal limits.

Kidneys are within normal limits. No hydronephrosis.

Bladder is within normal limits.

Stomach/Bowel: Stomach is within normal limits.

No evidence of bowel obstruction.

Appendix is not discretely visualized.

No colonic wall thickening or inflammatory changes.

Vascular/Lymphatic: No evidence of abdominal aortic aneurysm.

Atherosclerotic calcifications of the abdominal aorta and branch
vessels.

No suspicious abdominopelvic lymphadenopathy.

Reproductive: Heterogeneous prostate in this patient with newly
diagnosed prostate cancer.

Other: No abdominopelvic ascites.

Musculoskeletal: Subcentimeter sclerotic lesion in the right iliac
bone (series 2/image 74), without typical imaging features to
suggest metastasis.

Thoracolumbar spine within normal limits.
IMPRESSION: Heterogeneous prostate in this patient with newly diagnosed prostate
cancer.

No findings suspicious for metastatic disease. Correlate with
pending bone scan.

## 2021-02-13 ENCOUNTER — Ambulatory Visit
Admission: RE | Admit: 2021-02-13 | Discharge: 2021-02-13 | Disposition: A | Discharge status: Home / Self Care | Payer: Managed Care, Other (non HMO) | Source: Ambulatory Visit | Attending: Radiation Oncology | Admitting: Radiation Oncology

## 2021-02-13 ENCOUNTER — Other Ambulatory Visit: Payer: Self-pay

## 2021-02-13 VITALS — BP 114/74 | HR 50 | Wt 201.9 lb

## 2021-02-13 DIAGNOSIS — N529 Male erectile dysfunction, unspecified: Secondary | ICD-10-CM | POA: Diagnosis not present

## 2021-02-13 DIAGNOSIS — C61 Malignant neoplasm of prostate: Secondary | ICD-10-CM | POA: Insufficient documentation

## 2021-02-13 DIAGNOSIS — Z923 Personal history of irradiation: Secondary | ICD-10-CM | POA: Diagnosis not present

## 2021-02-13 NOTE — Progress Notes (Signed)
Radiation Oncology Follow up Note  Name: Antonio [name]   Date:   02/13/2021 MRN:  [id number] DOB: 01-03-1961    This 60 y.o. male presents to the clinic today for 53-month follow-up status post I-125 interstitial implant for Gleason 7 (3+4) adenocarcinoma presenting with a PSA of 6.5.  REFERRING PROVIDER: No ref. provider found  HPI: Patient is a 60 year old male now out 14 months having completed I-125 interstitial implant for Gleason 7 adenocarcinoma the prostate seen today in routine follow-up he is doing well specifically denies any increased lower urinary tract symptoms diarrhea or fatigue.  He is using Cialis on occasion for erectile dysfunction.  His most current PSA is less than 0.1.  Which was performed in May he has a another PSA scheduled for June. COMPLICATIONS OF TREATMENT: none  FOLLOW UP COMPLIANCE: keeps appointments   PHYSICAL EXAM:  BP 114/74   Pulse (!) 50   Wt 201 lb 14.4 oz (91.6 kg)   BMI 29.82 kg/m  Well-developed well-nourished patient in NAD. HEENT reveals PERLA, EOMI, discs not visualized.  Oral cavity is clear. No oral mucosal lesions are identified. Neck is clear without evidence of cervical or supraclavicular adenopathy. Lungs are clear to A&P. Cardiac examination is essentially unremarkable with regular rate and rhythm without murmur rub or thrill. Abdomen is benign with no organomegaly or masses noted. Motor sensory and DTR levels are equal and symmetric in the upper and lower extremities. Cranial nerves II through XII are grossly intact. Proprioception is intact. No peripheral adenopathy or edema is identified. No motor or sensory levels are noted. Crude visual fields are within normal range.  RADIOLOGY RESULTS: No current films for review  PLAN: At the present time patient is under excellent biochemical control of his prostate cancer.  I am pleased with his overall progress.  He has a low side effect profile.  I have asked to see him back in 1 year for  follow-up.  He continues close follow-up care with Drs. Nitschke.  He will have a PSA in June which I will review when it is available.  Patient knows to call with any concerns.  I would like to take this opportunity to thank you for allowing me to participate in the care of your patient.Noreene Filbert, MD

## 2021-04-11 ENCOUNTER — Other Ambulatory Visit: Payer: Self-pay | Admitting: Urology

## 2021-08-26 ENCOUNTER — Telehealth: Payer: Self-pay | Admitting: Urology

## 2021-08-26 DIAGNOSIS — C61 Malignant neoplasm of prostate: Secondary | ICD-10-CM

## 2021-08-26 NOTE — Telephone Encounter (Signed)
Pt wants to know if he can come in on Tuesday and get his PSA drawn prior to appt w/[facility].  It wasn't ordered and pt wasn't told he needed it, he just wants to have it checked.

## 2021-08-26 NOTE — Telephone Encounter (Signed)
Per provider's last note pt to have PSA drawn. PSA ordered. Lab appt scheduled.

## 2021-08-27 ENCOUNTER — Other Ambulatory Visit: Payer: Self-pay

## 2021-08-27 ENCOUNTER — Other Ambulatory Visit: Payer: Managed Care, Other (non HMO)

## 2021-08-27 DIAGNOSIS — C61 Malignant neoplasm of prostate: Secondary | ICD-10-CM

## 2021-08-28 ENCOUNTER — Encounter: Payer: Self-pay | Admitting: Urology

## 2021-08-28 ENCOUNTER — Ambulatory Visit: Payer: Managed Care, Other (non HMO) | Admitting: Urology

## 2021-08-28 VITALS — BP 105/64 | HR 67 | Ht 69.0 in | Wt 178.3 lb

## 2021-08-28 DIAGNOSIS — N529 Male erectile dysfunction, unspecified: Secondary | ICD-10-CM

## 2021-08-28 DIAGNOSIS — C61 Malignant neoplasm of prostate: Secondary | ICD-10-CM | POA: Diagnosis not present

## 2021-08-28 DIAGNOSIS — N401 Enlarged prostate with lower urinary tract symptoms: Secondary | ICD-10-CM

## 2021-08-28 DIAGNOSIS — N138 Other obstructive and reflux uropathy: Secondary | ICD-10-CM | POA: Diagnosis not present

## 2021-08-28 LAB — PSA: Prostate Specific Ag, Serum: 0.2 ng/mL (ref 0.0–4.0)

## 2021-08-28 LAB — BLADDER SCAN AMB NON-IMAGING

## 2021-08-28 MED ORDER — TADALAFIL 5 MG PO TABS: 5.0000 mg | ORAL_TABLET | Freq: Every day | ORAL | 11 refills | Status: DC | PRN

## 2021-08-28 NOTE — Addendum Note (Signed)
Addended by: Donalee Citrin on: 08/28/2021 04:05 PM   Modules accepted: Orders

## 2021-08-28 NOTE — Progress Notes (Signed)
   08/28/2021 3:52 PM   ROGAN WIGLEY [birth year] [id number]  Reason for visit: Follow up prostate cancer, ED, urinary symptoms  HPI: 60 year old male who underwent brachytherapy for unfavorable intermediate risk prostate cancer in November 2020.  He had some significant urinary symptoms for a few months afterwards that ultimately have now resolved.  He had a cystoscopy in June 2021 that was benign, prostate measured 29 g.  He currently is just taking the Flomax daily and is very happy with his urinary symptoms at this time with PVR of 0 mL and IPSS score today of 15 and quality of life pleased.  He is taking Cialis 5 mg on demand for ED with excellent results.  PSA had been undetectable, and most recently remain very low at 0.2.  We discussed the concept of a PSA bounce after brachytherapy, and the need for ongoing PSA monitoring.  I recommended follow-up in 6 months for lab visit for PSA.  Cialis and Flomax refilled RTC 6 months lab visit for PSA, call with results   Billey Co, [place] [address] [place], [place] [address] [phone]

## 2021-08-28 NOTE — Addendum Note (Signed)
Addended by: Donalee Citrin on: 08/28/2021 03:57 PM   Modules accepted: Orders

## 2021-10-02 ENCOUNTER — Other Ambulatory Visit: Payer: Self-pay

## 2021-10-02 ENCOUNTER — Ambulatory Visit: Payer: Managed Care, Other (non HMO) | Admitting: Podiatry

## 2021-10-02 DIAGNOSIS — M722 Plantar fascial fibromatosis: Secondary | ICD-10-CM

## 2021-10-03 NOTE — Progress Notes (Signed)
  Subjective:  Patient ID: Antonio [name], male    DOB: 1960-12-18,  MRN: [id number]  Chief Complaint  Patient presents with   Foot Pain    Lumps on the bottom of right foot    60 y.o. male presents with the above complaint. History confirmed with patient.  The bottom of his right foot has 2 painful soft tissue masses and only hurts if he walks barefoot is becoming more common to be painful.  He thinks he had them there for a long time have not significantly increased in size recently  Objective:  Physical Exam: warm, good capillary refill, no trophic changes or ulcerative lesions, normal DP and PT pulses, and normal sensory exam. Left Foot: normal exam, no swelling, tenderness, instability; ligaments intact, full range of motion of all ankle/foot joints Right Foot: 2 moderately sized plantar fibromas within the medial band of the plantar fascia  Assessment:   1. Plantar fibromatosis      Plan:  Patient was evaluated and treated and all questions answered.  I discussed etiology and treatment options of plantar fascial fibromatosis with him in detail including injection therapy offloading with orthotic support or a gel insert as well as excision.  Discussed the risk benefits and potential complications of all these.  I recommended injection therapy.  Following sterile prep with alcohol I injected each lesion with 5 mg of Kenalog and 2 mg of dexamethasone phosphate and 0.5 cc of lidocaine 1% plain.  He tolerated procedure well.  He will follow with me as needed for this.  No follow-ups on file.

## 2022-02-13 ENCOUNTER — Ambulatory Visit: Payer: Managed Care, Other (non HMO) | Admitting: Radiation Oncology

## 2022-02-27 ENCOUNTER — Other Ambulatory Visit: Payer: Managed Care, Other (non HMO)

## 2022-02-27 ENCOUNTER — Encounter: Payer: Self-pay | Admitting: Urology

## 2022-03-19 ENCOUNTER — Ambulatory Visit: Payer: Managed Care, Other (non HMO) | Admitting: Radiation Oncology

## 2022-04-01 ENCOUNTER — Other Ambulatory Visit: Payer: Self-pay | Admitting: Urology

## 2022-04-07 ENCOUNTER — Encounter: Payer: Self-pay | Admitting: Radiation Oncology

## 2022-04-09 DIAGNOSIS — I4891 Unspecified atrial fibrillation: Secondary | ICD-10-CM | POA: Insufficient documentation

## 2022-04-10 ENCOUNTER — Ambulatory Visit
Admission: RE | Admit: 2022-04-10 | Discharge: 2022-04-10 | Disposition: A | Discharge status: Home / Self Care | Payer: Managed Care, Other (non HMO) | Source: Ambulatory Visit | Attending: Radiation Oncology | Admitting: Radiation Oncology

## 2022-04-10 ENCOUNTER — Encounter: Payer: Self-pay | Admitting: Radiation Oncology

## 2022-04-10 VITALS — BP 112/59 | HR 47 | Resp 20 | Wt 174.1 lb

## 2022-04-10 DIAGNOSIS — C61 Malignant neoplasm of prostate: Secondary | ICD-10-CM | POA: Insufficient documentation

## 2022-04-10 DIAGNOSIS — Z923 Personal history of irradiation: Secondary | ICD-10-CM | POA: Insufficient documentation

## 2022-04-10 NOTE — Progress Notes (Signed)
Radiation Oncology Follow up Note  Name: Antonio [name]   Date:   04/10/2022 MRN:  [id number] DOB: [birth year]    This 61 y.o. male presents to the clinic today for over 2-year follow-up status post I-125 interstitial implant for Gleason 7 adenocarcinoma presenting with a PSA of 6.5.  REFERRING PROVIDER: No ref. provider found  HPI: Patient is a 61 year old male now out 2 years having completed I-125 interstitial implant for Gleason 7 adenocarcinoma the prostate.  Seen today in routine follow-up he is doing well specifically denies any increased lower urinary tract symptoms diarrhea or fatigue.  His most recent PSA is 0.2.  Almost exactly what it was 1 year prior.  COMPLICATIONS OF TREATMENT: none  FOLLOW UP COMPLIANCE: keeps appointments   PHYSICAL EXAM:  BP (!) 112/59   Pulse (!) 47   Resp 20   Wt 174 lb 1.6 oz (79 kg)   SpO2 100%   BMI 25.71 kg/m  Well-developed well-nourished patient in NAD. HEENT reveals PERLA, EOMI, discs not visualized.  Oral cavity is clear. No oral mucosal lesions are identified. Neck is clear without evidence of cervical or supraclavicular adenopathy. Lungs are clear to A&P. Cardiac examination is essentially unremarkable with regular rate and rhythm without murmur rub or thrill. Abdomen is benign with no organomegaly or masses noted. Motor sensory and DTR levels are equal and symmetric in the upper and lower extremities. Cranial nerves II through XII are grossly intact. Proprioception is intact. No peripheral adenopathy or edema is identified. No motor or sensory levels are noted. Crude visual fields are within normal range.  RADIOLOGY RESULTS: No current films for review  PLAN: Present time patient is under excellent biochemical control of his prostate cancer.  Pleased with his overall progress.  Of asked to see him back in 1 year for follow-up with a PSA at that time.  Patient knows to call with any concerns.  I would like to take this opportunity to thank you  for allowing me to participate in the care of your patient.Noreene Filbert, MD

## 2022-05-06 ENCOUNTER — Telehealth: Payer: Self-pay | Admitting: Urology

## 2022-05-06 NOTE — Telephone Encounter (Signed)
PT [facility] requesting a refill for Tamsulosin.

## 2022-05-06 NOTE — Telephone Encounter (Signed)
Pt is overdue for an appointment, please schedule.

## 2022-09-08 ENCOUNTER — Ambulatory Visit: Payer: Managed Care, Other (non HMO) | Admitting: Podiatry

## 2022-09-08 ENCOUNTER — Encounter: Payer: Self-pay | Admitting: Podiatry

## 2022-09-08 DIAGNOSIS — R52 Pain, unspecified: Secondary | ICD-10-CM | POA: Diagnosis not present

## 2022-09-08 DIAGNOSIS — G5761 Lesion of plantar nerve, right lower limb: Secondary | ICD-10-CM

## 2022-09-09 ENCOUNTER — Inpatient Hospital Stay
Admission: EM | Admit: 2022-09-09 | Discharge: 2022-09-11 | DRG: 291 | Payer: Managed Care, Other (non HMO) | Attending: Internal Medicine | Admitting: Internal Medicine

## 2022-09-09 ENCOUNTER — Emergency Department
Admit: 2022-09-09 | Discharge: 2022-09-09 | Disposition: A | Discharge status: Home / Self Care | Payer: Managed Care, Other (non HMO) | Attending: Emergency Medicine

## 2022-09-09 ENCOUNTER — Emergency Department: Payer: Managed Care, Other (non HMO)

## 2022-09-09 ENCOUNTER — Other Ambulatory Visit: Payer: Self-pay

## 2022-09-09 DIAGNOSIS — I110 Hypertensive heart disease with heart failure: Principal | ICD-10-CM | POA: Diagnosis present

## 2022-09-09 DIAGNOSIS — Z952 Presence of prosthetic heart valve: Secondary | ICD-10-CM | POA: Diagnosis not present

## 2022-09-09 DIAGNOSIS — I480 Paroxysmal atrial fibrillation: Secondary | ICD-10-CM | POA: Diagnosis present

## 2022-09-09 DIAGNOSIS — N179 Acute kidney failure, unspecified: Secondary | ICD-10-CM | POA: Diagnosis present

## 2022-09-09 DIAGNOSIS — I200 Unstable angina: Secondary | ICD-10-CM | POA: Diagnosis not present

## 2022-09-09 DIAGNOSIS — Z8249 Family history of ischemic heart disease and other diseases of the circulatory system: Secondary | ICD-10-CM

## 2022-09-09 DIAGNOSIS — E785 Hyperlipidemia, unspecified: Secondary | ICD-10-CM

## 2022-09-09 DIAGNOSIS — I959 Hypotension, unspecified: Secondary | ICD-10-CM | POA: Diagnosis present

## 2022-09-09 DIAGNOSIS — Z7982 Long term (current) use of aspirin: Secondary | ICD-10-CM | POA: Diagnosis not present

## 2022-09-09 DIAGNOSIS — Z79899 Other long term (current) drug therapy: Secondary | ICD-10-CM | POA: Diagnosis not present

## 2022-09-09 DIAGNOSIS — Z1152 Encounter for screening for COVID-19: Secondary | ICD-10-CM | POA: Diagnosis not present

## 2022-09-09 DIAGNOSIS — D72829 Elevated white blood cell count, unspecified: Secondary | ICD-10-CM | POA: Diagnosis present

## 2022-09-09 DIAGNOSIS — D696 Thrombocytopenia, unspecified: Secondary | ICD-10-CM | POA: Diagnosis present

## 2022-09-09 DIAGNOSIS — Z953 Presence of xenogenic heart valve: Secondary | ICD-10-CM | POA: Diagnosis not present

## 2022-09-09 DIAGNOSIS — G4733 Obstructive sleep apnea (adult) (pediatric): Secondary | ICD-10-CM | POA: Diagnosis present

## 2022-09-09 DIAGNOSIS — I2489 Other forms of acute ischemic heart disease: Secondary | ICD-10-CM | POA: Diagnosis present

## 2022-09-09 DIAGNOSIS — I5031 Acute diastolic (congestive) heart failure: Secondary | ICD-10-CM | POA: Diagnosis present

## 2022-09-09 DIAGNOSIS — I440 Atrioventricular block, first degree: Secondary | ICD-10-CM | POA: Diagnosis present

## 2022-09-09 DIAGNOSIS — N400 Enlarged prostate without lower urinary tract symptoms: Secondary | ICD-10-CM

## 2022-09-09 DIAGNOSIS — I509 Heart failure, unspecified: Secondary | ICD-10-CM | POA: Diagnosis not present

## 2022-09-09 DIAGNOSIS — I5021 Acute systolic (congestive) heart failure: Secondary | ICD-10-CM | POA: Diagnosis not present

## 2022-09-09 DIAGNOSIS — I491 Atrial premature depolarization: Secondary | ICD-10-CM | POA: Diagnosis present

## 2022-09-09 DIAGNOSIS — I719 Aortic aneurysm of unspecified site, without rupture: Secondary | ICD-10-CM | POA: Diagnosis present

## 2022-09-09 DIAGNOSIS — R079 Chest pain, unspecified: Secondary | ICD-10-CM | POA: Diagnosis not present

## 2022-09-09 LAB — CBC
HCT: 39 % (ref 39.0–52.0)
Hemoglobin: 12.7 g/dL — ABNORMAL LOW (ref 13.0–17.0)
MCH: 30.7 pg (ref 26.0–34.0)
MCHC: 32.6 g/dL (ref 30.0–36.0)
MCV: 94.2 fL (ref 80.0–100.0)
Platelets: 128 10*3/uL — ABNORMAL LOW (ref 150–400)
RBC: 4.14 MIL/uL — ABNORMAL LOW (ref 4.22–5.81)
RDW: 14.2 % (ref 11.5–15.5)
WBC: 13 10*3/uL — ABNORMAL HIGH (ref 4.0–10.5)
nRBC: 0 % (ref 0.0–0.2)

## 2022-09-09 LAB — BRAIN NATRIURETIC PEPTIDE: B Natriuretic Peptide: 1162.1 pg/mL — ABNORMAL HIGH (ref 0.0–100.0)

## 2022-09-09 LAB — RESP PANEL BY RT-PCR (FLU A&B, COVID) ARPGX2
Influenza A by PCR: NEGATIVE
Influenza B by PCR: NEGATIVE
SARS Coronavirus 2 by RT PCR: NEGATIVE

## 2022-09-09 LAB — LACTIC ACID, PLASMA: Lactic Acid, Venous: 2.3 mmol/L (ref 0.5–1.9)

## 2022-09-09 LAB — BASIC METABOLIC PANEL
Anion gap: 6 (ref 5–15)
BUN: 54 mg/dL — ABNORMAL HIGH (ref 6–20)
CO2: 22 mmol/L (ref 22–32)
Calcium: 9.1 mg/dL (ref 8.9–10.3)
Chloride: 109 mmol/L (ref 98–111)
Creatinine, Ser: 1.93 mg/dL — ABNORMAL HIGH (ref 0.61–1.24)
GFR, Estimated: 39 mL/min — ABNORMAL LOW (ref >60)
Glucose, Bld: 115 mg/dL — ABNORMAL HIGH (ref 70–99)
Potassium: 4.2 mmol/L (ref 3.5–5.1)
Sodium: 137 mmol/L (ref 135–145)

## 2022-09-09 LAB — PROCALCITONIN: Procalcitonin: <0.1 ng/mL

## 2022-09-09 LAB — TROPONIN I (HIGH SENSITIVITY)
Troponin I (High Sensitivity): 103 ng/L (ref <18)
Troponin I (High Sensitivity): 110 ng/L (ref <18)

## 2022-09-09 MED ORDER — FUROSEMIDE 10 MG/ML IJ SOLN
40.0000 mg | Freq: Once | INTRAMUSCULAR | Status: AC
  Administered 2022-09-09: 40 mg via INTRAVENOUS
  Filled 2022-09-09: qty 4

## 2022-09-09 MED ORDER — TRAZODONE HCL 50 MG PO TABS: 25.0000 mg | ORAL_TABLET | Freq: Every evening | ORAL | Status: DC | PRN

## 2022-09-09 MED ORDER — MAGNESIUM HYDROXIDE 400 MG/5ML PO SUSP: 30.0000 mL | Freq: Every day | ORAL | Status: DC | PRN

## 2022-09-09 MED ORDER — LACTATED RINGERS IV BOLUS
1000.0000 mL | Freq: Once | INTRAVENOUS | Status: AC
  Administered 2022-09-09: 1000 mL via INTRAVENOUS

## 2022-09-09 MED ORDER — METOPROLOL SUCCINATE ER 50 MG PO TB24: 100.0000 mg | ORAL_TABLET | Freq: Every day | ORAL | Status: DC

## 2022-09-09 MED ORDER — LORATADINE 10 MG PO TABS: 10.0000 mg | ORAL_TABLET | Freq: Every day | ORAL | Status: DC

## 2022-09-09 MED ORDER — ENOXAPARIN SODIUM 40 MG/0.4ML IJ SOSY
40.0000 mg | PREFILLED_SYRINGE | INTRAMUSCULAR | Status: DC
  Administered 2022-09-10 – 2022-09-11 (×2): 40 mg via SUBCUTANEOUS
  Filled 2022-09-09 (×2): qty 0.4

## 2022-09-09 MED ORDER — ATORVASTATIN CALCIUM 20 MG PO TABS
10.0000 mg | ORAL_TABLET | Freq: Every evening | ORAL | Status: DC
  Administered 2022-09-10: 10 mg via ORAL
  Filled 2022-09-09: qty 1

## 2022-09-09 MED ORDER — TAMSULOSIN HCL 0.4 MG PO CAPS
0.4000 mg | ORAL_CAPSULE | Freq: Every day | ORAL | Status: DC
  Administered 2022-09-10 – 2022-09-11 (×2): 0.4 mg via ORAL
  Filled 2022-09-09 (×2): qty 1

## 2022-09-09 MED ORDER — FUROSEMIDE 10 MG/ML IJ SOLN
40.0000 mg | Freq: Two times a day (BID) | INTRAMUSCULAR | Status: DC
  Filled 2022-09-09: qty 4

## 2022-09-09 MED ORDER — ASPIRIN 81 MG PO CHEW
324.0000 mg | CHEWABLE_TABLET | Freq: Once | ORAL | Status: AC
  Administered 2022-09-09: 324 mg via ORAL
  Filled 2022-09-09: qty 4

## 2022-09-09 MED ORDER — ASPIRIN 81 MG PO TBEC
81.0000 mg | DELAYED_RELEASE_TABLET | Freq: Every day | ORAL | Status: DC
  Administered 2022-09-10 – 2022-09-11 (×2): 81 mg via ORAL
  Filled 2022-09-09 (×2): qty 1

## 2022-09-09 MED ORDER — ACETAMINOPHEN 325 MG PO TABS: 650.0000 mg | ORAL_TABLET | Freq: Four times a day (QID) | ORAL | Status: DC | PRN

## 2022-09-09 MED ORDER — ONDANSETRON HCL 4 MG/2ML IJ SOLN: 4.0000 mg | Freq: Four times a day (QID) | INTRAMUSCULAR | Status: DC | PRN

## 2022-09-09 MED ORDER — SODIUM CHLORIDE 0.9 % IV BOLUS
1000.0000 mL | Freq: Once | INTRAVENOUS | Status: AC
  Administered 2022-09-09: 1000 mL via INTRAVENOUS

## 2022-09-09 MED ORDER — MIDODRINE HCL 5 MG PO TABS
10.0000 mg | ORAL_TABLET | Freq: Three times a day (TID) | ORAL | Status: DC
  Administered 2022-09-10 – 2022-09-11 (×5): 10 mg via ORAL
  Filled 2022-09-09 (×5): qty 2

## 2022-09-09 MED ORDER — ACETAMINOPHEN 650 MG RE SUPP: 650.0000 mg | Freq: Four times a day (QID) | RECTAL | Status: DC | PRN

## 2022-09-09 MED ORDER — ONDANSETRON HCL 4 MG PO TABS: 4.0000 mg | ORAL_TABLET | Freq: Four times a day (QID) | ORAL | Status: DC | PRN

## 2022-09-09 MED ORDER — POLYVINYL ALCOHOL 1.4 % OP SOLN: 1.0000 [drp] | Freq: Three times a day (TID) | OPHTHALMIC | Status: DC | PRN

## 2022-09-09 NOTE — ED Triage Notes (Signed)
Pt reports he started feeling bad yesterday and today started having centralized chest pressure. Pt reports at home BP 44H systolic and HR in the 60X. Pt also endorses nausea, poor appetite and SOB. Pt has had AA replacement.

## 2022-09-09 NOTE — ED Provider Notes (Addendum)
[facility] Provider Note    Event Date/Time   First MD Initiated Contact with Patient 09/09/22 1756     (approximate)   History   Chest Pain   HPI  Antonio [name] is a 61 y.o. male past medical history of aortic valve replacement with bioprosthetic valve, hypertension and OSA A-fib who presents with chest pain.  Patient went for his typical 3 mile walk on Sunday said he just did not feel quite as good as he normally does but was not having any shortness of breath or chest pain during the walk.  Then yesterday again went for a walk and again did not feel quite as good as normal and was mildly dyspneic but did not have any chest pain.  This morning around 10:30 AM he started having some pressure center of his chest that is nonradiating not associated with nausea or diaphoresis.  Occurred while he was sitting down lasted for several minutes and then resolve had a second episode as well.  He then tried to go for a walk and he felt very short of breath typically he would walk about 3 miles at a brisk pace and was only able to do about 1/4 mile and it took him about 30 minutes and he had to stop.  He did not have chest pain during the walk.  When he went inside he did have recurrent episode of chest pain.  Checked his blood pressure and it was low in the 70 systolic heart rate was in the 40s.  Typically heart rate runs in the 50s blood pressure is not that low.  He also endorses feeling short of breath with laying flat which is new.  He has had a mild headache and fatigue denies fevers or chills.  Mild nonproductive cough.  Denies myalgias sore throat or other respiratory symptoms.  He is not have any chest pain currently but does feel dyspneic.  He has no history of coronary disease.  Had his aortic valve replaced about 18 years ago and follows at [facility] for this has been told that he can likely stretch it to another year before it needs to be replaced again.  Patient takes aspirin  but is not on a blood thinner.  Has had decreased appetite over the last 2 days as well.  Past Medical History:  Diagnosis Date   Aortic aneurysm ([place])    Aortic insufficiency 07/12/2014   Overview:  Mild to moderate   H/O bicuspid aortic valve 11/04/2013   Heart palpitations 05/15/2017   Hypertension    Metabolic syndrome 07/29/1739   OSA (obstructive sleep apnea) 06/11/2018   no CPAP/None suggested   Premature ventricular contractions 05/28/2017   S/P aortic valve replacement with bioprosthetic valve 11/04/2013   Overview:  With carpentier-edwards bovine pericardial valve and aortic aneurysm repair 03/26/06 by Dr Ysidro Evert at [facility]    Patient Active Problem List   Diagnosis Date Noted   Atrial fibrillation and flutter ([place]) 04/09/2022   Prostate cancer ([place]) 81/44/8185   Metabolic syndrome 63/14/9702   OSA (obstructive sleep apnea) 06/11/2018   Premature ventricular contractions 05/28/2017   Heart palpitations 05/15/2017   Aortic insufficiency 07/12/2014   H/O bicuspid aortic valve 11/04/2013   S/P aortic valve replacement with bioprosthetic valve 11/04/2013     Physical Exam  Triage Vital Signs: ED Triage Vitals  Enc Vitals Group     BP 09/09/22 1602 (!) 97/31     Pulse Rate 09/09/22 1602 (!) 57  Resp 09/09/22 1602 18     Temp 09/09/22 1602 97.7 F (36.5 C)     Temp Source 09/09/22 1602 Oral     SpO2 --      Weight --      Height --      Head Circumference --      Peak Flow --      Pain Score 09/09/22 1617 6     Pain Loc --      Pain Edu? --      Excl. in [place]? --     Most recent vital signs: Vitals:   09/09/22 2130 09/09/22 2301  BP: (!) 101/43 (!) 115/59  Pulse: 68   Resp:    Temp:    SpO2: 93%      General: Awake, no distress.  CV:  Good peripheral perfusion.  No edema or asymmetry Resp:  Normal effort.  Patient's lungs are clear he appears comfortable Abd:  No distention.  Neuro:             Awake, Alert, Oriented x 3  Other:     ED Results /  Procedures / Treatments  Labs (all labs ordered are listed, but only abnormal results are displayed) Labs Reviewed  BASIC METABOLIC PANEL - Abnormal; Notable for the following components:      Result Value   Glucose, Bld 115 (*)    BUN 54 (*)    Creatinine, Ser 1.93 (*)    GFR, Estimated 39 (*)    All other components within normal limits  CBC - Abnormal; Notable for the following components:   WBC 13.0 (*)    RBC 4.14 (*)    Hemoglobin 12.7 (*)    Platelets 128 (*)    All other components within normal limits  BRAIN NATRIURETIC PEPTIDE - Abnormal; Notable for the following components:   B Natriuretic Peptide 1,162.1 (*)    All other components within normal limits  LACTIC ACID, PLASMA - Abnormal; Notable for the following components:   Lactic Acid, Venous 2.3 (*)    All other components within normal limits  TROPONIN I (HIGH SENSITIVITY) - Abnormal; Notable for the following components:   Troponin I (High Sensitivity) 103 (*)    All other components within normal limits  TROPONIN I (HIGH SENSITIVITY) - Abnormal; Notable for the following components:   Troponin I (High Sensitivity) 110 (*)    All other components within normal limits  RESP PANEL BY RT-PCR (FLU A&B, COVID) ARPGX2  CULTURE, BLOOD (ROUTINE X 2)  CULTURE, BLOOD (ROUTINE X 2)  PROCALCITONIN  LACTIC ACID, PLASMA     EKG  EKG shows sinus bradycardia with PVCs, subtle ST depression in 1 aVL and aVF   RADIOLOGY I reviewed and interpreted the CXR which does not show any acute cardiopulmonary process    PROCEDURES:  Critical Care performed: Yes, see critical care procedure note(s)  .Critical Care  Performed by: Rada Hay, MD Authorized by: Rada Hay, MD   Critical care provider statement:    Critical care time (minutes):  75   Critical care was time spent personally by me on the following activities:  Development of treatment plan with patient or surrogate, discussions with consultants,  evaluation of patient's response to treatment, examination of patient, ordering and review of laboratory studies, ordering and review of radiographic studies, ordering and performing treatments and interventions, pulse oximetry, re-evaluation of patient's condition and review of old charts   The patient is on the  cardiac monitor to evaluate for evidence of arrhythmia and/or significant heart rate changes.   MEDICATIONS ORDERED IN ED: Medications  sodium chloride 0.9 % bolus 1,000 mL (0 mLs Intravenous Stopped 09/09/22 1700)  lactated ringers bolus 1,000 mL (0 mLs Intravenous Stopped 09/09/22 2238)  aspirin chewable tablet 324 mg (324 mg Oral Given 09/09/22 1842)  furosemide (LASIX) injection 40 mg (40 mg Intravenous Given 09/09/22 2233)     IMPRESSION / MDM / ASSESSMENT AND PLAN / ED COURSE  I reviewed the triage vital signs and the nursing notes.                              Patient's presentation is most consistent with acute presentation with potential threat to life or bodily function.  Differential diagnosis includes, but is not limited to, acute coronary syndrome, acute heart failure, aortic valve failure, pulmonary embolism, viral syndrome, sepsis, bacteremia, endocarditis   The patient is a 61 year old male past medical history of prior aortic valve repair about 18 years ago who presents because of exertional dyspnea and chest pain has been feeling just not well for the last 3 days with new exertional dyspnea yesterday and chest pain today.  The chest pain has been 3 discrete episodes that were pressure-like without radiation or associated symptoms and occurred at rest.  He tried to go for his typical walk today but was extremely dyspneic having to sit down is not normal for him.  He is quite fit and walks about 3 miles briskly per day usually.    His blood pressure is low 97 over 30s he is bradycardic.  His initial EKG has PVCs and some ST depression in 1 and aVL and aVF.  He is  not having any chest pain currently.  After liter of fluid his blood pressure still on the low side so we will give another liter of LR.  Troponin mildly positive around 100.  Mean is 1.93 from baseline of around 1.2 several years ago.  I performed a bedside ultrasound to look at his global cardiac function and his EF does look good there is no obvious pericardial effusion or right heart strain.  Chest x-ray is read by radiology as some possible pulmonary vascular prominence.  Given patient's new exertional dyspnea chest pressure and his history of aortic valve replacement I am concerned that his aortic valve could be part of the issue.  Given the hypotension and him feeling generally unwell we will send lactate blood cultures and COVID testing.  Patient will require admission.  Plan to discuss with cardiology.  COVID and influenza test are negative.  Repeat troponin is 110 so flat.  Lactate mildly elevated at 2.3.  Pro-Cal is negative.  BNP 1100.  I discussed with Dr. Nehemiah Massed with cardiology who recommends giving patient some diuresis and admission he is okay with him staying at home.  Patient's blood pressure right now MAP is 61 this is after 2 L of fluid.  I am concerned that he will need diuresis but with the borderline blood pressure somewhat hesitant to be aggressive with this.  I will discuss with the ICU.  Patient continues to look quite well.    Spoke with Gaspar Garbe with the ICU who thinks it is probably best to just hold off on aggressive diuresis at this point given blood pressure is borderline and that patient is not in any overt respiratory distress and wait to see what his  echo shows.  I have ordered the echo.  Not currently is 64.  Will discuss with the hospitalist.  Discussed with Dr. Sidney Ace who feels patient will likely need pressors.  I will give a dose of 40 mg of IV Lasix in the ED and see how patient responds.  If pressures holding he can go to stepdown if becomes hypotensive will need  pressors.  Patient urinated after a dose of Lasix.  Blood pressure 115/59 with map of 69.  Dr. Sidney Ace will admit the patient.  FINAL CLINICAL IMPRESSION(S) / ED DIAGNOSES   Final diagnoses:  Congestive heart failure, unspecified HF chronicity, unspecified heart failure type ([place])     Rx / DC Orders   ED Discharge Orders     None        Note:  This document was prepared using Dragon voice recognition software and may include unintentional dictation errors.   Rada Hay, MD 09/09/22 2127    Rada Hay, MD 09/09/22 2217    Rada Hay, MD 09/09/22 2318

## 2022-09-09 NOTE — H&P (Addendum)
[place]   PATIENT NAME: Antonio [name]    MR#:  [id number]  DATE OF BIRTH:  [birth year]  DATE OF ADMISSION:  09/09/2022  PRIMARY CARE PHYSICIAN: Patient, No Pcp Per   Patient is coming from: Home  REQUESTING/REFERRING PHYSICIAN: Rada Hay, MD  CHIEF COMPLAINT:   Chief Complaint  Patient presents with   Chest Pain    HISTORY OF PRESENT ILLNESS:  Antonio [name] is a 61 y.o. Caucasian male with medical history significant for essential hypertension, OSA, aortic insufficiency status post aortic valve replacement with Bovie in valve and aortic aneurysm repair in 2007 who has been in his normal active state of health walking 3 to 4 miles a day, 4 days a week, until Sunday when he started having headache and yesterday he was not feeling right before walking half a mile he had to stop and was feeling restless and short of breath.  This morning he did not feel good and experienced chest tightness that he graded 6/10 in severity with no radiation or nausea or vomiting or diaphoresis.  He admits to occasional cough with deep breathing and denies any worsening lower extremity edema.  No significant orthopnea or proximal nocturnal dyspnea.  He admitted to dyspnea on exertion though.  No fever or chills.  No dysuria or hematuria, urgency or frequency or flank pain.  He he admits to diminished urine output as well as recent constipation.  No bleeding diathesis.  ED Course: When came to the ER, BP was 97/31 with a MAP of 51 heart rate of 57 with otherwise normal vital signs.  He was initially hydrated with IV normal saline and his BP was still 91/48 with a MAP of 62 respiratory rate of 32 then 26.  He maintained mostly normal pulse oximetry.  His labs revealed a BNP of 1162.1 and high sensitive troponin of 110 with lactic acid of 2.3 and procalcitonin less than 0.1.  Fons antigens and COVID-19 PCR came back negative.  His BMP showed a BUN of 54 and creatinine 1.93 with previous levels here for  comparison.  CBC showed leukocytosis of 13 with thrombocytopenia of 128. EKG as reviewed by me : EKG showed sinus bradycardia with a rate of 57 with premature supraventricular complexes and occasional PVCs Imaging: Two-view chest x-ray showed central pulmonary vessels that are prominent without signs of pulmonary edema and no focal pulmonary consolidation.  There is no pleural effusion or pneumothorax.  2D echo report is currently pending.  The patient was initially given 1 L bolus of IV lactated Ringer and 1 L of IV normal saline before work-up became clear about acute CHF.  He was later given 40 mg of IV Lasix and his BP was 115/59 with a MAP of 65 and respiratory to 26 and pulse currently 95% on room air.  He will be admitted to a progressive unit bed for further evaluation and management.  PAST MEDICAL HISTORY:   Past Medical History:  Diagnosis Date   Aortic aneurysm ([place])    Aortic insufficiency 07/12/2014   Overview:  Mild to moderate   H/O bicuspid aortic valve 11/04/2013   Heart palpitations 05/15/2017   Hypertension    Metabolic syndrome 11/30/5100   OSA (obstructive sleep apnea) 06/11/2018   no CPAP/None suggested   Premature ventricular contractions 05/28/2017   S/P aortic valve replacement with bioprosthetic valve 11/04/2013   Overview:  With carpentier-edwards bovine pericardial valve and aortic aneurysm repair 03/26/06 by Dr Ysidro Evert at  [facility]    PAST SURGICAL HISTORY:   Past Surgical History:  Procedure Laterality Date   APPENDECTOMY     CYSTOSCOPY N/A 10/17/2019   Procedure: CYSTOSCOPY;  Surgeon: Billey Co, MD;  Location: [facility] ORS;  Service: Urology;  Laterality: N/A;   RADIOACTIVE SEED IMPLANT N/A 10/17/2019   Procedure: RADIOACTIVE SEED IMPLANT/BRACHYTHERAPY IMPLANT;  Surgeon: Billey Co, MD;  Location: [facility] ORS;  Service: Urology;  Laterality: N/A;   valve replacement      SOCIAL HISTORY:   Social History   Tobacco Use   Smoking status: Never    Smokeless tobacco: Never  Substance Use Topics   Alcohol use: No    FAMILY HISTORY:   Family History  Problem Relation Age of Onset   Hypertension Mother     DRUG ALLERGIES:  No Known Allergies  REVIEW OF SYSTEMS:   ROS As per history of present illness. All pertinent systems were reviewed above. Constitutional, HEENT, cardiovascular, respiratory, GI, GU, musculoskeletal, neuro, psychiatric, endocrine, integumentary and hematologic systems were reviewed and are otherwise negative/unremarkable except for positive findings mentioned above in the HPI.   MEDICATIONS AT HOME:   Prior to Admission medications   Medication Sig Start Date End Date Taking? Authorizing Provider  acetaminophen (TYLENOL) 500 MG tablet Take 1,000 mg by mouth every 6 (six) hours as needed (for pain.).    [provider]  aspirin EC 81 MG tablet Take 81 mg by mouth daily.    [provider]  atorvastatin (LIPITOR) 10 MG tablet Take 10 mg by mouth every evening.     [provider]  atorvastatin (LIPITOR) 10 MG tablet Take 1 tablet by mouth daily. 01/20/22   [provider]  cetirizine (ZYRTEC) 10 MG tablet Take 10 mg by mouth daily.    [provider]  metoprolol succinate (TOPROL-XL) 50 MG 24 hr tablet Take 50 mg by mouth daily. Take with or immediately following a meal.    [provider]  Polyethyl Glycol-Propyl Glycol (LUBRICANT EYE DROPS) 0.4-0.3 % SOLN Place 1 drop into both eyes 3 (three) times daily as needed (dry/irritated eyes.).    [provider]  tadalafil (CIALIS) 5 MG tablet Take 1-3 tablets (5-15 mg total) by mouth daily as needed for erectile dysfunction. 08/28/21   Billey Co, MD  tamsulosin (FLOMAX) 0.4 MG CAPS capsule TAKE 1 CAPSULE(0.4 MG) BY MOUTH DAILY AFTER SUPPER Patient taking differently: Take 0.4 mg by mouth daily. 04/12/21   Billey Co, MD      VITAL SIGNS:  Blood pressure (!) 103/48, pulse 67, temperature  97.7 F (36.5 C), temperature source Oral, resp. rate (!) 33, SpO2 93 %.  PHYSICAL EXAMINATION:  Physical Exam  GENERAL:  61 y.o.-year-old patient lying in the bed with no acute distress however with mild conversational dyspnea.Marland Kitchen  EYES: Pupils equal, round, reactive to light and accommodation. No scleral icterus. Extraocular muscles intact.  HEENT: Head atraumatic, normocephalic. Oropharynx and nasopharynx clear.  NECK:  Supple, no jugular venous distention. No thyroid enlargement, no tenderness.  LUNGS: Diminished bibasilar breath sounds with bibasal rales.  No use of accessory muscles of respiration.  CARDIOVASCULAR: Regular rate and rhythm, S1, S2 normal. No murmurs, rubs, or gallops.  ABDOMEN: Soft, nondistended, nontender. Bowel sounds present. No organomegaly or mass.  EXTREMITIES: No pedal edema, cyanosis, or clubbing.  NEUROLOGIC: Cranial nerves II through XII are intact. Muscle strength 5/5 in all extremities. Sensation intact. Gait not checked.  PSYCHIATRIC: The patient is alert  and oriented x 3.  Normal affect and good eye contact. SKIN: No obvious rash, lesion, or ulcer.   LABORATORY PANEL:   CBC Recent Labs  Lab 09/09/22 1556  WBC 13.0*  HGB 12.7*  HCT 39.0  PLT 128*   ------------------------------------------------------------------------------------------------------------------  Chemistries  Recent Labs  Lab 09/09/22 1556  NA 137  K 4.2  CL 109  CO2 22  GLUCOSE 115*  BUN 54*  CREATININE 1.93*  CALCIUM 9.1   ------------------------------------------------------------------------------------------------------------------  Cardiac Enzymes No results for input(s): "TROPONINI" in the last 168 hours. ------------------------------------------------------------------------------------------------------------------  RADIOLOGY:  DG Chest 2 View  Result Date: 09/09/2022 CLINICAL DATA:  Chest pain EXAM: CHEST - 2 VIEW COMPARISON:  CT done on 09/11/2005  FINDINGS: Transverse diameter of heart is within normal limits. There is prosthetic aortic valve. Central pulmonary vessels are prominent. There are no signs of alveolar pulmonary edema. There is no pleural effusion or pneumothorax. Surgical clips are noted in the right chest wall. IMPRESSION: Central pulmonary vessels are prominent without signs of pulmonary edema. There is no focal pulmonary consolidation. There is no pleural effusion or pneumothorax. Electronically Signed   By: Elmer Picker M.D.   On: 09/09/2022 16:52      IMPRESSION AND PLAN:  Assessment and Plan: * Acute CHF (congestive heart failure) ([place]) - I suspect acute diastolic CHF of new onset. - This could be related to the failure of his aortic valve prosthesis. - The patient will be admitted to a progressive unit bed. - We will continue diuresis with IV Lasix while closely monitoring his blood pressure. - A 2D echo was ordered and the report is currently pending. - Cardiology consult will be obtained. - I notified Dr. Nehemiah Massed about the patient.. - We will follow I's and O's and daily weights.  Chest pain - We will follow serial troponins. - So far troponin I was 103 and later 110. -Elevated troponin is likely secondary to his acute CHF. - We will place him on aspirin as well as as needed sublingual nitroglycerin and IV morphine sulfate for pain while closely monitoring his blood pressure.  Hypotension - This could be actually decreased cardiac output from acute CHF. - It has responded to diuresis. - We will add p.o. midodrine and monitor his BP.  BPH (benign prostatic hyperplasia) - We will continue Flomax.  Hx of aortic valve replacement - We will follow echo results and obtain a cardiology consultation for further assessment of his bovine aortic valve function.  Dyslipidemia - We will continue statin therapy.   DVT prophylaxis: Lovenox. Advanced Care Planning:  Code Status: full code.   Family  Communication:  The plan of care was discussed in details with the patient (and family). I answered all questions. The patient agreed to proceed with the above mentioned plan. Further management will depend upon hospital course. Disposition Plan: Back to previous home environment Consults called: Cardiology. All the records are reviewed and case discussed with ED provider.  Status is: Inpatient   At the time of the admission, it appears that the appropriate admission status for this patient is inpatient.  This is judged to be reasonable and necessary in order to provide the required intensity of service to ensure the patient's safety given the presenting symptoms, physical exam findings and initial radiographic and laboratory data in the context of comorbid conditions.  The patient requires inpatient status due to high intensity of service, high risk of further deterioration and high frequency of surveillance required.  I certify  that at the time of admission, it is my clinical judgment that the patient will require inpatient hospital care extending more than 2 midnights.                            Dispo: The patient is from: Home              Anticipated d/c is to: Home              Patient currently is not medically stable to d/c.              Difficult to place patient: No  Christel Mormon M.D on 09/09/2022 at 11:56 PM  [facility]   From 7 PM-7 AM, contact night-coverage [email or web address]  CC: Primary care physician; Patient, No Pcp Per

## 2022-09-09 NOTE — Progress Notes (Signed)
  Subjective:  Patient ID: Antonio [name], male    DOB: [birth year],  MRN: [id number]  Chief Complaint  Patient presents with   Toe Pain    Right foot  2nd and 3rd toe pt stated that he has a tingling burning sensation between his toes at the ball of his foot     61 y.o. male presents with the above complaint. History confirmed with patient.  This is a new issue unrelated to the masses in his heart she has had before.  He notes a weird tingling burning sensation that makes the toes sometimes go numb  Objective:  Physical Exam: warm, good capillary refill, no trophic changes or ulcerative lesions, normal DP and PT pulses, normal sensory exam, and pain palpation to plantar right second interspace with radiation into the toes there is a slight palpable Mulder's click.  Assessment:   1. Morton's metatarsalgia, neuralgia, or neuroma, right   2. New complaint of pain      Plan:  Patient was evaluated and treated and all questions answered.  Morton Neuroma -Educated on etiology -Educated on padding and proper shoegear.  I dispensed metatarsal pads to offload this.  We discussed custom molded orthosis with this integrated may alleviate this long-term for him if the metatarsal pads are helpful -Injection delivered to the affected interspaces following sterile prep with alcohol the right second interspace was injected with 5 mg of Kenalog, 2 mg of dexamethasone and 0.5 cc each of 2% lidocaine and 0.5% Marcaine plain.  Return if symptoms worsen or fail to improve.

## 2022-09-09 NOTE — ED Provider Triage Note (Signed)
Emergency Medicine Provider Triage Evaluation Note  Antonio [name], a 61 y.o. male  was evaluated in triage.  Pt complains of generalized malaise and poor oral intake. He also notes some centralized chest pain and low BP readings. 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   Review of Systems  Positive: Weakness, chest pain Negative: Vomiting, Diarrhea  Physical Exam  BP (!) 97/31 (BP Location: Left Arm)   Pulse (!) 57   Temp 97.7 F (36.5 C) (Oral)   Resp 18  Gen:   Awake, no distress  NAD Resp:  Normal effort CTA MSK:   Moves extremities without difficulty  Other:  Soft, nontender  Medical Decision Making  Medically screening exam initiated at 4:17 PM.  Appropriate orders placed.  [name] was informed that the remainder of the evaluation will be completed by another provider, this initial triage assessment does not replace that evaluation, and the importance of remaining in the ED until their evaluation is complete.  Patient with a significant cardiac history, presents to the ED for evaluation of generalized malaise and weakness.  Also reports some centralized chest pressure.  Has noted low blood pressures over the last 2 days.   Melvenia Needles, PA-C 09/09/22 1621

## 2022-09-09 NOTE — Assessment & Plan Note (Signed)
-   We will follow echo results and obtain a cardiology consultation for further assessment of his bovine aortic valve function.

## 2022-09-09 NOTE — Assessment & Plan Note (Signed)
-   We will continue statin therapy. 

## 2022-09-09 NOTE — Assessment & Plan Note (Signed)
-   I suspect acute diastolic CHF of new onset. - This could be related to the failure of his aortic valve prosthesis. - The patient will be admitted to a progressive unit bed. - We will continue diuresis with IV Lasix while closely monitoring his blood pressure. - A 2D echo was ordered and the report is currently pending. - Cardiology consult will be obtained. - I notified Dr. Nehemiah Massed about the patient.. - We will follow I's and O's and daily weights.

## 2022-09-09 NOTE — Assessment & Plan Note (Signed)
-   This could be actually decreased cardiac output from acute CHF. - It has responded to diuresis. - We will add p.o. midodrine and monitor his BP.

## 2022-09-09 NOTE — Assessment & Plan Note (Signed)
-   We will continue Flomax 

## 2022-09-09 NOTE — Assessment & Plan Note (Signed)
-   We will follow serial troponins. - So far troponin I was 103 and later 110. -Elevated troponin is likely secondary to his acute CHF. - We will place him on aspirin as well as as needed sublingual nitroglycerin and IV morphine sulfate for pain while closely monitoring his blood pressure.

## 2022-09-10 DIAGNOSIS — I200 Unstable angina: Secondary | ICD-10-CM

## 2022-09-10 DIAGNOSIS — I509 Heart failure, unspecified: Secondary | ICD-10-CM | POA: Diagnosis not present

## 2022-09-10 DIAGNOSIS — N400 Enlarged prostate without lower urinary tract symptoms: Secondary | ICD-10-CM | POA: Diagnosis not present

## 2022-09-10 DIAGNOSIS — I5021 Acute systolic (congestive) heart failure: Secondary | ICD-10-CM | POA: Diagnosis not present

## 2022-09-10 LAB — ECHOCARDIOGRAM COMPLETE
AV Mean grad: 30 mmHg
AV Peak grad: 52.1 mmHg
Ao pk vel: 3.61 m/s
MV M vel: 5.55 m/s
MV Peak grad: 123 mmHg
P 1/2 time: 249 msec
S' Lateral: 3.7 cm

## 2022-09-10 LAB — BASIC METABOLIC PANEL
Anion gap: 9 (ref 5–15)
BUN: 46 mg/dL — ABNORMAL HIGH (ref 6–20)
CO2: 19 mmol/L — ABNORMAL LOW (ref 22–32)
Calcium: 8.1 mg/dL — ABNORMAL LOW (ref 8.9–10.3)
Chloride: 110 mmol/L (ref 98–111)
Creatinine, Ser: 1.66 mg/dL — ABNORMAL HIGH (ref 0.61–1.24)
GFR, Estimated: 47 mL/min — ABNORMAL LOW (ref >60)
Glucose, Bld: 112 mg/dL — ABNORMAL HIGH (ref 70–99)
Potassium: 4 mmol/L (ref 3.5–5.1)
Sodium: 138 mmol/L (ref 135–145)

## 2022-09-10 LAB — TROPONIN I (HIGH SENSITIVITY)
Troponin I (High Sensitivity): 142 ng/L (ref <18)
Troponin I (High Sensitivity): 119 ng/L (ref <18)

## 2022-09-10 LAB — CBC
HCT: 35.2 % — ABNORMAL LOW (ref 39.0–52.0)
Hemoglobin: 11.7 g/dL — ABNORMAL LOW (ref 13.0–17.0)
MCH: 30.9 pg (ref 26.0–34.0)
MCHC: 33.2 g/dL (ref 30.0–36.0)
MCV: 92.9 fL (ref 80.0–100.0)
Platelets: 87 10*3/uL — ABNORMAL LOW (ref 150–400)
RBC: 3.79 MIL/uL — ABNORMAL LOW (ref 4.22–5.81)
RDW: 14.5 % (ref 11.5–15.5)
WBC: 12.2 10*3/uL — ABNORMAL HIGH (ref 4.0–10.5)
nRBC: 0 % (ref 0.0–0.2)

## 2022-09-10 LAB — BRAIN NATRIURETIC PEPTIDE: B Natriuretic Peptide: 1594.5 pg/mL — ABNORMAL HIGH (ref 0.0–100.0)

## 2022-09-10 LAB — LACTIC ACID, PLASMA: Lactic Acid, Venous: 2.3 mmol/L (ref 0.5–1.9)

## 2022-09-10 MED ORDER — FUROSEMIDE 10 MG/ML IJ SOLN
20.0000 mg | Freq: Two times a day (BID) | INTRAMUSCULAR | Status: DC
  Administered 2022-09-10 – 2022-09-11 (×2): 20 mg via INTRAVENOUS
  Filled 2022-09-10: qty 4

## 2022-09-10 MED ORDER — METOPROLOL SUCCINATE ER 50 MG PO TB24: 25.0000 mg | ORAL_TABLET | Freq: Every day | ORAL | Status: DC

## 2022-09-10 NOTE — Progress Notes (Signed)
[facility]                                                                              Antonio [name], is a 61 y.o. male, DOB - [birth year], [id number] Admit date - 09/09/2022    Outpatient Primary MD for the patient is Patient, No Pcp Per  LOS - 1  days  Chief Complaint  Patient presents with   Chest Pain       Brief summary   Patient is a 60 year old male with HTN, OSA, aortic insufficiency status post aortic valve replacement with bioprosthetic valve, aortic aneurysm repair in 2007 who has been in his normal active state of health walking 3 to 4 miles a day, 4 days a week until Sunday when he started having headache.  A day before the admission, he was not feeling right before walking half a mile and had to stop and was feeling restless and short of breath.  On the morning of admission, he experienced chest tightness, 6/10 with no radiation, nausea vomiting or diaphoresis.  Also admitted to occasional cough with deep breathing, no worsening lower extremity edema.  No significant orthopnea or PND. In ED, BNP 1062.1, troponin 110 with lactic acid 2.3.  COVID-19 negative.  Creatinine 1.9, WBCs 13. Chest x-ray showed central pulmonary vessels that are prominent without signs of pulmonary edema and no focal pulmonary consolidation.    Assessment & Plan    Principal Problem:   Acute CHF (congestive heart failure) ([place]), new onset -In the setting of aortic valve prosthesis, concern for failure.  Weight on admission 175lbs, per patient, his dry weight is around 1 65-1 67 lbs -Started on IV Lasix for diuresis, 40 mg every 12 hours - strict I's and O's and daily weights -Follow 2D echo, cardiology consulted  Active Problems:   Chest pain -Likely due to #1, follow 2D echo,  -may need further invasive intervention including cardiac cath, will defer to cardiology     Hypotension, bradycardia -BP soft, was placed on midodrine to allow IV diuresis.  Heart rate in  50-60 -Hold Toprol-XL     Dyslipidemia -Continue statin    Hx of aortic valve replacement -Follow 2D echo, cardiology consulted    BPH (benign prostatic hyperplasia) Continue Flomax   Code Status: Full code DVT Prophylaxis:  enoxaparin (LOVENOX) injection 40 mg Start: 09/10/22 0800   Level of Care: Level of care: Progressive Family Communication: Updated patient's wife at the bedside  Disposition Plan:      Remains inpatient appropriate: Awaiting cardiology recommendations, on IV Lasix for diuresis   Procedures:  None  Consultants:   Cardiology  Antimicrobials: None  Medications  aspirin EC  81 mg Oral Daily   atorvastatin  10 mg Oral QPM   enoxaparin (LOVENOX) injection  40 mg Subcutaneous Q24H   furosemide  40 mg Intravenous Q12H   metoprolol succinate  100 mg Oral Q breakfast   midodrine  10 mg Oral TID WC   tamsulosin  0.4 mg Oral Daily      Subjective:   Antonio [name] was seen and examined today.  Slightly feeling better today after IV  Lasix diuresis.  No chest pain, no nausea vomiting or abdominal pain.  No fevers.  BP soft.    Objective:   Vitals:   09/10/22 0626 09/10/22 0900 09/10/22 1000 09/10/22 1300  BP: (!) 102/52  (!) 107/42 (!) 102/44  Pulse: 60  (!) 55 (!) 59  Resp: (!) 24  18 (!) 22  Temp: 98.6 F (37 C)  98.8 F (37.1 C)   TempSrc: Oral  Oral   SpO2: 96%  97% 96%  Weight:  78.3 kg    Height:        Intake/Output Summary (Last 24 hours) at 09/10/2022 1458 Last data filed at 09/10/2022 0616 Gross per 24 hour  Intake 1000 ml  Output 400 ml  Net 600 ml     Wt Readings from Last 3 Encounters:  09/10/22 78.3 kg  04/10/22 79 kg  08/28/21 80.9 kg     Exam General: Alert and oriented x 3, NAD Cardiovascular: S1 S2 auscultated,  RRR Respiratory: Diminished breath sound at the bases Gastrointestinal: Soft, nontender, nondistended, + bowel sounds Ext: no pedal edema bilaterally Neuro: no new deficits Psych: Normal affect and  demeanor, alert and oriented x3     Data Reviewed:  I have personally reviewed following labs    CBC Lab Results  Component Value Date   WBC 12.2 (H) 09/10/2022   RBC 3.79 (L) 09/10/2022   HGB 11.7 (L) 09/10/2022   HCT 35.2 (L) 09/10/2022   MCV 92.9 09/10/2022   MCH 30.9 09/10/2022   PLT 87 (L) 09/10/2022   MCHC 33.2 09/10/2022   RDW 14.5 46/80/3212     Last metabolic panel Lab Results  Component Value Date   NA 138 09/10/2022   K 4.0 09/10/2022   CL 110 09/10/2022   CO2 19 (L) 09/10/2022   BUN 46 (H) 09/10/2022   CREATININE 1.66 (H) 09/10/2022   GLUCOSE 112 (H) 09/10/2022   GFRNONAA 47 (L) 09/10/2022   CALCIUM 8.1 (L) 09/10/2022   ANIONGAP 9 09/10/2022    CBG (last 3)  No results for input(s): "GLUCAP" in the last 72 hours.    Coagulation Profile: No results for input(s): "INR", "PROTIME" in the last 168 hours.   Radiology Studies: I have personally reviewed the imaging studies  DG Chest 2 View  Result Date: 09/09/2022 CLINICAL DATA:  Chest pain EXAM: CHEST - 2 VIEW COMPARISON:  CT done on 09/11/2005 FINDINGS: Transverse diameter of heart is within normal limits. There is prosthetic aortic valve. Central pulmonary vessels are prominent. There are no signs of alveolar pulmonary edema. There is no pleural effusion or pneumothorax. Surgical clips are noted in the right chest wall. IMPRESSION: Central pulmonary vessels are prominent without signs of pulmonary edema. There is no focal pulmonary consolidation. There is no pleural effusion or pneumothorax. Electronically Signed   By: Elmer Picker M.D.   On: 09/09/2022 16:52       Antonio [name] M.D. [facility] 09/10/2022, 2:58 PM  Available via Epic secure chat 7am-7pm After 7 pm, please refer to night coverage provider listed on amion.

## 2022-09-10 NOTE — ED Notes (Signed)
Pt on the phone with cardiology, cardiology giving pt an update.

## 2022-09-11 DIAGNOSIS — I5021 Acute systolic (congestive) heart failure: Secondary | ICD-10-CM | POA: Diagnosis not present

## 2022-09-11 DIAGNOSIS — I509 Heart failure, unspecified: Secondary | ICD-10-CM | POA: Diagnosis not present

## 2022-09-11 DIAGNOSIS — N400 Enlarged prostate without lower urinary tract symptoms: Secondary | ICD-10-CM | POA: Diagnosis not present

## 2022-09-11 DIAGNOSIS — I200 Unstable angina: Secondary | ICD-10-CM | POA: Diagnosis not present

## 2022-09-11 LAB — HIV ANTIBODY (ROUTINE TESTING W REFLEX): HIV Screen 4th Generation wRfx: NONREACTIVE

## 2022-09-11 MED ORDER — TRAZODONE HCL 50 MG PO TABS: 25.0000 mg | ORAL_TABLET | Freq: Every evening | ORAL | Status: DC | PRN

## 2022-09-11 MED ORDER — MIDODRINE HCL 10 MG PO TABS: 10.0000 mg | ORAL_TABLET | Freq: Three times a day (TID) | ORAL | Status: DC

## 2022-09-11 MED ORDER — MIDODRINE HCL 5 MG PO TABS
10.0000 mg | ORAL_TABLET | Freq: Once | ORAL | Status: AC
  Administered 2022-09-11: 10 mg via ORAL
  Filled 2022-09-11: qty 2

## 2022-09-11 NOTE — Discharge Summary (Signed)
DISCHARGE SUMMARY/ TRANSFER SUMMARY   Yianni Skilling [id number]  is a 61 y.o. male  Outpatient Primary MD for the patient is Patient, No Pcp Per Admission date: 09/09/2022 Transfer Date 09/11/2022 Admitting Physician Christel Mormon, MD  Place of Transfer: [facility] Accepting MD: Dr. Pura Spice   Mode: [facility]ambulance   Admission Diagnosis  Acute CHF (congestive heart failure) [facility]) [I50.9]  Discharge Diagnosis    Acute congestive heart failure Acute aortic insufficiency History of AVR 2007, bovine Acute kidney injury Elevated troponins likely demand ischemia Hypotension, bradycardia Dyslipidemia BPH  Past Medical History:  Diagnosis Date   Aortic aneurysm ([place])    Aortic insufficiency 07/12/2014   Overview:  Mild to moderate   H/O bicuspid aortic valve 11/04/2013   Heart palpitations 05/15/2017   Hypertension    Metabolic syndrome 11/30/5100   OSA (obstructive sleep apnea) 06/11/2018   no CPAP/None suggested   Premature ventricular contractions 05/28/2017   S/P aortic valve replacement with bioprosthetic valve 11/04/2013   Overview:  With carpentier-edwards bovine pericardial valve and aortic aneurysm repair 03/26/06 by Dr Ysidro Evert [facility]    Past Surgical History:  Procedure Laterality Date   APPENDECTOMY     CYSTOSCOPY N/A 10/17/2019   Procedure: CYSTOSCOPY;  Surgeon: Billey Co, MD;  Location: [facility] ORS;  Service: Urology;  Laterality: N/A;   RADIOACTIVE SEED IMPLANT N/A 10/17/2019   Procedure: RADIOACTIVE SEED IMPLANT/BRACHYTHERAPY IMPLANT;  Surgeon: Billey Co, MD;  Location: [facility] ORS;  Service: Urology;  Laterality: N/A;   valve replacement      Consults  cardiology   Hospital Course  Patient is a 61 year old male with HTN, OSA, aortic insufficiency status post aortic valve replacement with bioprosthetic valve, aortic aneurysm repair in 2007 who has been in his normal active state of health walking 3 to 4 miles a day,  4 days a week until Sunday when he started having headache.  A day before the admission, he was not feeling right before walking half a mile and had to stop and was feeling restless and short of breath.  On the morning of admission, he experienced chest tightness, 6/10 with no radiation, nausea vomiting or diaphoresis.  Also admitted to occasional cough with deep breathing, no worsening lower extremity edema.  No significant orthopnea or PND. In ED, BNP 1062.1, troponin 110 with lactic acid 2.3.  COVID-19 negative.  Creatinine 1.9, WBCs 13. Chest x-ray showed central pulmonary vessels that are prominent without signs of pulmonary edema and no focal pulmonary consolidation.    Acute CHF (congestive heart failure) ([place]), new onset -In the setting of history of bicuspid AV status post bovine AVR and AAA repair 2007 presented with chest pain, dyspnea on exertion and sudden new exercise intolerance.  Presented with an acute heart failure with AKI and hypotension. -2D echo showed EF 60 to 65% new moderate aortic insufficiency concerning for failure of his AVR. -Cardiology was consulted.  BNP 1100 with new O2 requirement 2 L. -Patient was not able to tolerate IV diuresis.  Continue aspirin 81 mg, atorvastatin 10 mg -Per cardiology may need a TEE, cardiac MRI for further evaluation however due to acute onset of aortic insufficiency, acute CHF recommended transfer to tertiary care facility that has CT surgery.  Patient has had prior cardiac care at [facility].  Dr. Jerrye Beavers, cardiology contacted [facility] transfer center and patient is accepted for further work-up    Chest pain -Likely due to #1 -Management as above  Hypotension, bradycardia -Patient was not able to tolerate IV diuresis, beta-blocker was held due to bradycardia -He was placed on midodrine 10 mg 3 times daily      Dyslipidemia -Continue statin     BPH (benign prostatic hyperplasia) Continue Flomax   Today    Subjective:   Patient was seen and examined this morning, does not feel any better today.  Feeling more short of breath and winded, no chest pain, no fevers or chills, no coughing.   Objective:   Blood pressure (!) 107/51, pulse (!) 58, temperature 98.3 F (36.8 C), temperature source Oral, resp. rate (!) 23, height '5\' 9"'$  (1.753 m), weight 78.3 kg, SpO2 100 %.  Intake/Output Summary (Last 24 hours) at 09/11/2022 1029 Last data filed at 09/11/2022 0432 Gross per 24 hour  Intake --  Output 345 ml  Net -345 ml    Exam Awake Alert, Oriented *3, No new F.N deficits, appearing uncomfortable, sitting upright and visibly short of breath HEENT:EOMI, PERLA Neck: Supple, + JVD  Chest: Bibasilar crackles CVS: bradycardia, 4/6 systolic murmur Abdomen: Normal bowel sounds, Soft, Non tender, Extremeties: No Cyanosis, Clubbing or edema, No new Rash or bruise Neuro: no new FND's  Data Review  CBC w Diff:  Lab Results  Component Value Date   WBC 12.2 (H) 09/10/2022   HGB 11.7 (L) 09/10/2022   HCT 35.2 (L) 09/10/2022   PLT 87 (L) 09/10/2022   CMP:  Lab Results  Component Value Date   NA 138 09/10/2022   K 4.0 09/10/2022   CL 110 09/10/2022   CO2 19 (L) 09/10/2022   BUN 46 (H) 09/10/2022   CREATININE 1.66 (H) 09/10/2022  .  Significant Tests   Significant Diagnostic Studies:  ECHOCARDIOGRAM COMPLETE  Result Date: 09/10/2022    ECHOCARDIOGRAM REPORT   Patient Name:   WIL SLAPE Date of Exam: 09/09/2022 Medical Rec #:  [id number]    Height:       69.0 in Accession #:    [id number]   Weight:       174.1 lb Date of Birth:  May 09, 1961     BSA:          1.948 m Patient Age:    34 years     BP:           102/45 mmHg Patient Gender: M            HR:           70 bpm. Exam Location:  [facility] Procedure: 2D Echo, Cardiac Doppler and Color Doppler Indications:     I50.9 Congestive heart failure  History:         Patient has prior history of Echocardiogram examinations, most                   recent 05/03/2021. Aortic Valve Replacement (04/22/06)-                  Carpentier Edwards Bovine. Aortic Aneurysm Repair (04/22/06).,                  Arrythmias:Atrial Fibrillation; Risk Factors:Hypertension.                  History of bicuspid aortic valve. Palpitations. Obstructive                  sleep apnea.  Sonographer:     Cresenciano Lick RDCS Referring Phys:  [id number] [facility] Diagnosing Phys: Yolonda Kida  MD IMPRESSIONS  1. Left ventricular ejection fraction, by estimation, is 60 to 65%. The left ventricle has normal function. The left ventricle has no regional wall motion abnormalities. The left ventricular internal cavity size was moderately to severely dilated. Left ventricular diastolic parameters are consistent with Grade III diastolic dysfunction (restrictive).  2. Right ventricular systolic function is normal. The right ventricular size is normal.  3. Left atrial size was moderately dilated.  4. Right atrial size was mild to moderately dilated.  5. The mitral valve is normal in structure. Mild mitral valve regurgitation.  6. The aortic valve is calcified. Aortic valve regurgitation is moderate. Aortic valve sclerosis/calcification is present, without any evidence of aortic stenosis. FINDINGS  Left Ventricle: Left ventricular ejection fraction, by estimation, is 60 to 65%. The left ventricle has normal function. The left ventricle has no regional wall motion abnormalities. The left ventricular internal cavity size was moderately to severely dilated. There is borderline concentric left ventricular hypertrophy. Left ventricular diastolic parameters are consistent with Grade III diastolic dysfunction (restrictive). Right Ventricle: The right ventricular size is normal. No increase in right ventricular wall thickness. Right ventricular systolic function is normal. Left Atrium: Left atrial size was moderately dilated. Right Atrium: Right atrial size was mild to moderately dilated.  Pericardium: There is no evidence of pericardial effusion. Mitral Valve: The mitral valve is normal in structure. Mild mitral valve regurgitation. Tricuspid Valve: The tricuspid valve is normal in structure. Tricuspid valve regurgitation is mild. Aortic Valve: The aortic valve is calcified. Aortic valve regurgitation is moderate. Aortic regurgitation PHT measures 249 msec. Aortic valve sclerosis/calcification is present, without any evidence of aortic stenosis. Aortic valve mean gradient measures  30.0 mmHg. Aortic valve peak gradient measures 52.1 mmHg. There is a bioprosthetic valve present in the aortic position. Pulmonic Valve: The pulmonic valve was normal in structure. Pulmonic valve regurgitation is trivial. Aorta: The ascending aorta was not well visualized. IAS/Shunts: No atrial level shunt detected by color flow Doppler.  LEFT VENTRICLE PLAX 2D LVIDd:         5.80 cm Diastology LVIDs:         3.70 cm LV e' medial:    13.40 cm/s LV PW:         1.10 cm LV E/e' medial:  9.9 LV IVS:        1.10 cm LV e' lateral:   20.00 cm/s                        LV E/e' lateral: 6.6  RIGHT VENTRICLE             IVC RV Basal diam:  5.00 cm     IVC diam: 1.70 cm RV S prime:     16.35 cm/s TAPSE (M-mode): 2.3 cm LEFT ATRIUM             Index        RIGHT ATRIUM           Index LA diam:        5.30 cm 2.72 cm/m   RA Area:     22.90 cm LA Vol (A2C):   95.4 ml 48.98 ml/m  RA Volume:   78.00 ml  40.05 ml/m LA Vol (A4C):   95.4 ml 48.98 ml/m LA Biplane Vol: 95.7 ml 49.13 ml/m  AORTIC VALVE AV Vmax:           360.80 cm/s AV Vmean:  255.600 cm/s AV VTI:            0.698 m AV Peak Grad:      52.1 mmHg AV Mean Grad:      30.0 mmHg LVOT Vmax:         108.33 cm/s LVOT Vmean:        75.367 cm/s LVOT VTI:          0.226 m LVOT/AV VTI ratio: 0.32 AI PHT:            249 msec  AORTA Ao Root diam: 3.90 cm Ao Asc diam:  3.00 cm MR Peak grad: 123.0 mmHg    TRICUSPID VALVE MR Vmax:      554.50 cm/s   TR Peak grad:   45.4 mmHg MV E  velocity: 133.00 cm/s  TR Vmax:        337.00 cm/s MV A velocity: 56.60 cm/s MV E/A ratio:  2.35         SHUNTS                             Systemic VTI: 0.23 m Yolonda Kida MD Electronically signed by Yolonda Kida MD Signature Date/Time: 09/10/2022/4:30:51 PM    Final    DG Chest 2 View  Result Date: 09/09/2022 CLINICAL DATA:  Chest pain EXAM: CHEST - 2 VIEW COMPARISON:  CT done on 09/11/2005 FINDINGS: Transverse diameter of heart is within normal limits. There is prosthetic aortic valve. Central pulmonary vessels are prominent. There are no signs of alveolar pulmonary edema. There is no pleural effusion or pneumothorax. Surgical clips are noted in the right chest wall. IMPRESSION: Central pulmonary vessels are prominent without signs of pulmonary edema. There is no focal pulmonary consolidation. There is no pleural effusion or pneumothorax. Electronically Signed   By: Elmer Picker M.D.   On: 09/09/2022 16:52    2D ECHO:    Scheduled Meds:  aspirin EC  81 mg Oral Daily   atorvastatin  10 mg Oral QPM   enoxaparin (LOVENOX) injection  40 mg Subcutaneous Q24H   furosemide  20 mg Intravenous Q12H   midodrine  10 mg Oral TID WC   tamsulosin  0.4 mg Oral Daily   Continuous Infusions:     The risks and  Benefits of transporting including disability, discomfort and death. were discussed with the patient or health power of attorney and were agreeable to the plan and further management.  Total Time in preparing paper work, todays exam and data evaluation: 35 minutes  Signed: Estill Cotta M.D. [facility] 09/11/2022, 10:29 AM

## 2022-09-11 NOTE — ED Notes (Signed)
Patient accepted to [facility]  room [id number]  report [phone]

## 2022-09-11 NOTE — Progress Notes (Signed)
TOC consulted for heart failure screen. Heart failure RN Jimsey has been informed.  ° ° °Eva Vallee, LCSW °[phone] ° °

## 2022-09-11 NOTE — Consult Note (Signed)
[name] NOTE       Patient ID: Antonio [name] MRN: [id number] DOB/AGE: 1961-03-18 61 y.o.  Admit date: 09/09/2022 Referring Physician Dr. Eugenie Norrie  Primary Physician  Primary Cardiologist Dr. Saralyn Pilar Reason for Consultation acute CHF, h/o bovine AVR 2007  HPI: Antonio [name]. Stoudt is a 61 year old male with a PMH of 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 Ysidro Evert, [facility]), paroxysmal atrial fibrillation (1% burden 7-day Holter monitor) not on chronic AC (CHA2DS2-VASc 0) who presented to [facility] ED the evening of 09/09/2022 with chest pain, dyspnea on exertion, and new exercise intolerance (very recently able to walk 3 miles at a brisk pace).  He presents in acute heart failure with an AKI.  Echo this admission with new moderate aortic insufficiency, concerning for failure of his AVR.  Cardiology is formally consulted on 10/19 for further assistance.  The patient presents with his son (transplant coordinator for [facility] [facility]), he was recently seen by his regular cardiologist on 10/9 and he was doing very well from a cardiac standpoint.  His most recent echocardiogram from May/2023 showed a well-seated bovine AVR with mild aortic insufficiency.  He had been regularly exercising over the past year and a half by running 4 to 5 miles per day at a 9-minute mile pace without difficulty.    He says that on Sunday 10/15 he took a walk about 3 miles and felt not as good as he usually did.  Then Monday 10/16 he went on a walk again but was short of breath during this which was very unusual for him.  He came home and sat down and had some central chest pressure without associated nausea, diaphoresis, or radiation and was self terminating after couple minutes.  He thought he would try to get up and walk some more to see if that made him feel better, but he only got about 1/4 mile and he needed to stop multiple times to catch his breath doing this and it ultimately took him 30 minutes to  complete this distance.  He went inside and checked his blood pressure and it was very low for him, running 70/40.  He says he "just knew something was not right" and so he presented to the ED.  In my time of evaluation the patient is short of breath and speaking in short phrases, he stood up to use the restroom about 15 minutes prior to my arrival and he still feels winded from this.  He currently denies any chest pain, dizziness, palpitations, peripheral edema.  He has been orthopneic, which is also new for him.  He denies any recent illness, fever, chills, sick contacts, new or change in his medications, or change in his diet (specifically no salty foods).   In the ED, he was given 325 mg aspirin, 2 L of IV fluids due to his low blood pressure, followed by 2 doses of IV Lasix 20 mg 12 hours apart the following day in addition to midodrine.  Further diuresis has been limited due to hypotension (current blood pressure 97/56).  SPO2 98% on 2 L by nasal cannula.   Labs are notable for an acute kidney injury with BUN/creatinine 54/1.93 and GFR 39, with slight improvement the following day to 46/1.66 and GFR 47.  Improving leukocytosis to 13,000 downtrending to 12,200 yesterday.  H&H and platelets also downtrending at 11.7/35.2 with platelets 87.  (Was 12.7/39 and 128 respectively on 10/17) BNP uptrending from 1160-2 1594, high-sensitivity troponin elevated  with a flat trend at 103-110-142-119.  Negative procalcitonin.  Chest x-ray with prominent central pulmonary vessels without pulmonary edema, pleural effusion or focal consolidation.  EKG shows sinus rhythm with premature atrial contractions and a first-degree AV block rate 62, repeat shows sinus bradycardia with rate 57 and occasional PVCs   Review of systems complete and found to be negative unless listed above     Past Medical History:  Diagnosis Date   Aortic aneurysm (HCC)    Aortic insufficiency 07/12/2014   Overview:  Mild to moderate   H/O  bicuspid aortic valve 11/04/2013   Heart palpitations 05/15/2017   Hypertension    Metabolic syndrome 05/29/1949   OSA (obstructive sleep apnea) 06/11/2018   no CPAP/None suggested   Premature ventricular contractions 05/28/2017   S/P aortic valve replacement with bioprosthetic valve 11/04/2013   Overview:  With carpentier-edwards bovine pericardial valve and aortic aneurysm repair 03/26/06 by Dr Ysidro Evert at [facility]    Past Surgical History:  Procedure Laterality Date   APPENDECTOMY     CYSTOSCOPY N/A 10/17/2019   Procedure: CYSTOSCOPY;  Surgeon: Billey Co, MD;  Location: [facility] ORS;  Service: Urology;  Laterality: N/A;   RADIOACTIVE SEED IMPLANT N/A 10/17/2019   Procedure: RADIOACTIVE SEED IMPLANT/BRACHYTHERAPY IMPLANT;  Surgeon: Billey Co, MD;  Location: [facility] ORS;  Service: Urology;  Laterality: N/A;   valve replacement      (Not in a hospital admission)  Social History   Socioeconomic History   Marital status: Single    Spouse name: Not on file   Number of children: Not on file   Years of education: Not on file   Highest education level: Not on file  Occupational History   Not on file  Tobacco Use   Smoking status: Never   Smokeless tobacco: Never  Vaping Use   Vaping Use: Never used  Substance and Sexual Activity   Alcohol use: No   Drug use: No   Sexual activity: Not on file  Other Topics Concern   Not on file  Social History Narrative   Not on file   Social Determinants of Health   Financial Resource Strain: Not on file  Food Insecurity: No Food Insecurity (09/10/2022)   Hunger Vital Sign    Worried About Running Out of Food in the Last Year: Never true    Ran Out of Food in the Last Year: Never true  Transportation Needs: No Transportation Needs (09/10/2022)   PRAPARE - Hydrologist (Medical): No    Lack of Transportation (Non-Medical): No  Physical Activity: Not on file  Stress: Not on file  Social Connections: Not on file   Intimate Partner Violence: Not At Risk (09/10/2022)   Humiliation, Afraid, Rape, and Kick questionnaire    Fear of Current or Ex-Partner: No    Emotionally Abused: No    Physically Abused: No    Sexually Abused: No    Family History  Problem Relation Age of Onset   Hypertension Mother      Vitals:   09/11/22 0100 09/11/22 0400 09/11/22 0415 09/11/22 0437  BP: (!) 89/42 (!) 95/43  (!) 107/51  Pulse: (!) 53 (!) 51 (!) 57 (!) 58  Resp: 19 (!) 27 (!) 32 (!) 23  Temp:    98.3 F (36.8 C)  TempSrc:    Oral  SpO2:    100%  Weight:      Height:        PHYSICAL EXAM  General: pleasant middle aged caucasian male, short of breath appearing while sitting upright in hospital bed. Son at bedside.  HEENT:  Normocephalic and atraumatic. Neck: + JVD.  Lungs: conversational dyspnea on 2L by [place], speaks in short phrases. Bibasilar crackles without wheezes. Heart: bradycardic but regular with occasional premature beats. 4/6 systolic murmur and a loud diastolic knock. Abdomen: Non-distended appearing.  Msk: Normal strength and tone for age. Extremities: Warm and well perfused. No clubbing, cyanosis. No peripheral edema.  Neuro: Alert and oriented X 3. Psych:  Answers questions appropriately.   Labs: Basic Metabolic Panel: Recent Labs    09/09/22 1556 09/10/22 0509  NA 137 138  K 4.2 4.0  CL 109 110  CO2 22 19*  GLUCOSE 115* 112*  BUN 54* 46*  CREATININE 1.93* 1.66*  CALCIUM 9.1 8.1*   Liver Function Tests: No results for input(s): "AST", "ALT", "ALKPHOS", "BILITOT", "PROT", "ALBUMIN" in the last 72 hours. No results for input(s): "LIPASE", "AMYLASE" in the last 72 hours. CBC: Recent Labs    09/09/22 1556 09/10/22 0509  WBC 13.0* 12.2*  HGB 12.7* 11.7*  HCT 39.0 35.2*  MCV 94.2 92.9  PLT 128* 87*   Cardiac Enzymes: Recent Labs    09/09/22 1855 09/10/22 0909 09/10/22 1512  TROPONINIHS 110* 142* 119*   BNP: Recent Labs    09/09/22 1855 09/10/22 0509  BNP  1,162.1* 1,594.5*   D-Dimer: No results for input(s): "DDIMER" in the last 72 hours. Hemoglobin A1C: No results for input(s): "HGBA1C" in the last 72 hours. Fasting Lipid Panel: No results for input(s): "CHOL", "HDL", "LDLCALC", "TRIG", "CHOLHDL", "LDLDIRECT" in the last 72 hours. Thyroid Function Tests: No results for input(s): "TSH", "T4TOTAL", "T3FREE", "THYROIDAB" in the last 72 hours.  Invalid input(s): "FREET3" Anemia Panel: No results for input(s): "VITAMINB12", "FOLATE", "FERRITIN", "TIBC", "IRON", "RETICCTPCT" in the last 72 hours.   Radiology: ECHOCARDIOGRAM COMPLETE  Result Date: 09/10/2022    ECHOCARDIOGRAM REPORT   Patient Name:   MISTER KRAHENBUHL Date of Exam: 09/09/2022 Medical Rec #:  [id number]    Height:       69.0 in Accession #:    [id number]   Weight:       174.1 lb Date of Birth:  02/13/1961     BSA:          1.948 m Patient Age:    13 years     BP:           102/45 mmHg Patient Gender: M            HR:           70 bpm. Exam Location:  [facility] Procedure: 2D Echo, Cardiac Doppler and Color Doppler Indications:     I50.9 Congestive heart failure  History:         Patient has prior history of Echocardiogram examinations, most                  recent 05/03/2021. Aortic Valve Replacement (04/22/06)-                  Carpentier Edwards Bovine. Aortic Aneurysm Repair (04/22/06).,                  Arrythmias:Atrial Fibrillation; Risk Factors:Hypertension.                  History of bicuspid aortic valve. Palpitations. Obstructive  sleep apnea.  Sonographer:     Cresenciano Lick RDCS Referring Phys:  [id number] [facility] Diagnosing Phys: Yolonda Kida MD IMPRESSIONS  1. Left ventricular ejection fraction, by estimation, is 60 to 65%. The left ventricle has normal function. The left ventricle has no regional wall motion abnormalities. The left ventricular internal cavity size was moderately to severely dilated. Left ventricular diastolic parameters are consistent  with Grade III diastolic dysfunction (restrictive).  2. Right ventricular systolic function is normal. The right ventricular size is normal.  3. Left atrial size was moderately dilated.  4. Right atrial size was mild to moderately dilated.  5. The mitral valve is normal in structure. Mild mitral valve regurgitation.  6. The aortic valve is calcified. Aortic valve regurgitation is moderate. Aortic valve sclerosis/calcification is present, without any evidence of aortic stenosis. FINDINGS  Left Ventricle: Left ventricular ejection fraction, by estimation, is 60 to 65%. The left ventricle has normal function. The left ventricle has no regional wall motion abnormalities. The left ventricular internal cavity size was moderately to severely dilated. There is borderline concentric left ventricular hypertrophy. Left ventricular diastolic parameters are consistent with Grade III diastolic dysfunction (restrictive). Right Ventricle: The right ventricular size is normal. No increase in right ventricular wall thickness. Right ventricular systolic function is normal. Left Atrium: Left atrial size was moderately dilated. Right Atrium: Right atrial size was mild to moderately dilated. Pericardium: There is no evidence of pericardial effusion. Mitral Valve: The mitral valve is normal in structure. Mild mitral valve regurgitation. Tricuspid Valve: The tricuspid valve is normal in structure. Tricuspid valve regurgitation is mild. Aortic Valve: The aortic valve is calcified. Aortic valve regurgitation is moderate. Aortic regurgitation PHT measures 249 msec. Aortic valve sclerosis/calcification is present, without any evidence of aortic stenosis. Aortic valve mean gradient measures  30.0 mmHg. Aortic valve peak gradient measures 52.1 mmHg. There is a bioprosthetic valve present in the aortic position. Pulmonic Valve: The pulmonic valve was normal in structure. Pulmonic valve regurgitation is trivial. Aorta: The ascending aorta was not  well visualized. IAS/Shunts: No atrial level shunt detected by color flow Doppler.  LEFT VENTRICLE PLAX 2D LVIDd:         5.80 cm Diastology LVIDs:         3.70 cm LV e' medial:    13.40 cm/s LV PW:         1.10 cm LV E/e' medial:  9.9 LV IVS:        1.10 cm LV e' lateral:   20.00 cm/s                        LV E/e' lateral: 6.6  RIGHT VENTRICLE             IVC RV Basal diam:  5.00 cm     IVC diam: 1.70 cm RV S prime:     16.35 cm/s TAPSE (M-mode): 2.3 cm LEFT ATRIUM             Index        RIGHT ATRIUM           Index LA diam:        5.30 cm 2.72 cm/m   RA Area:     22.90 cm LA Vol (A2C):   95.4 ml 48.98 ml/m  RA Volume:   78.00 ml  40.05 ml/m LA Vol (A4C):   95.4 ml 48.98 ml/m LA Biplane Vol: 95.7 ml 49.13 ml/m  AORTIC  VALVE AV Vmax:           360.80 cm/s AV Vmean:          255.600 cm/s AV VTI:            0.698 m AV Peak Grad:      52.1 mmHg AV Mean Grad:      30.0 mmHg LVOT Vmax:         108.33 cm/s LVOT Vmean:        75.367 cm/s LVOT VTI:          0.226 m LVOT/AV VTI ratio: 0.32 AI PHT:            249 msec  AORTA Ao Root diam: 3.90 cm Ao Asc diam:  3.00 cm MR Peak grad: 123.0 mmHg    TRICUSPID VALVE MR Vmax:      554.50 cm/s   TR Peak grad:   45.4 mmHg MV E velocity: 133.00 cm/s  TR Vmax:        337.00 cm/s MV A velocity: 56.60 cm/s MV E/A ratio:  2.35         SHUNTS                             Systemic VTI: 0.23 m Yolonda Kida MD Electronically signed by Yolonda Kida MD Signature Date/Time: 09/10/2022/4:30:51 PM    Final    DG Chest 2 View  Result Date: 09/09/2022 CLINICAL DATA:  Chest pain EXAM: CHEST - 2 VIEW COMPARISON:  CT done on 09/11/2005 FINDINGS: Transverse diameter of heart is within normal limits. There is prosthetic aortic valve. Central pulmonary vessels are prominent. There are no signs of alveolar pulmonary edema. There is no pleural effusion or pneumothorax. Surgical clips are noted in the right chest wall. IMPRESSION: Central pulmonary vessels are prominent without signs  of pulmonary edema. There is no focal pulmonary consolidation. There is no pleural effusion or pneumothorax. Electronically Signed   By: Elmer Picker M.D.   On: 09/09/2022 16:52    Prior echo 03/2022                        [place], [place] #: [id number]            [address] [place], [place] [address]       Date: 04/18/2022 01: 77 PM                                                               Adult   Male   Age: 58 yrs  ECHOCARDIOGRAM REPORT                              Outpatient                                                               [facility]       STUDY:CHEST WALL               TAPE:0000: 00: 0: 00: 00 MD1: Paraschos, MD Alex        ECHO:Yes    DOPPLER:Yes       FILE:[id number]        BP: 118/74 mmHg       COLOR:Yes   CONTRAST:No     MACHINE:Philips   RV BIOPSY:No          3D:No  SOUND QLTY:Moderate            Height: 69 in      MEDIUM:None                                              Weight: 173 lb                                                               BSA: 1.9 m2  _________________________________________________________________________________________                HISTORY: AVR                 REASON: Assess, LV function             INDICATION: 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, I48.91 Unspecified atrial                         fibrillation, I48.92 Unspecified atrial flutter  _________________________________________________________________________________________  ECHOCARDIOGRAPHIC MEASUREMENTS  2D DIMENSIONS  AORTA                  Values   Normal Range   MAIN PA         Values    Normal Range                Annulus: nm*          [2.3-2.9]         PA Main: 2.6 cm    [1.5-2.1]              Aorta Sin: 3.6 cm       [3.1-3.7]    RIGHT VENTRICLE            ST Junction: nm*           [2.6-3.2]         RV Base: 3.2 cm    [<4.2]              Asc.Aorta: 2.5 cm       [2.6-3.4]  RV Mid: 2.1 cm    [<3.5]  LEFT VENTRICLE                                      RV Length: nm*       [<8.6]                  LVIDd: 5.1 cm       [4.2-5.9]    INFERIOR VENA CAVA                  LVIDs: 3.0 cm                        Max. IVC: 1.5 cm    [<=2.1]                     FS: 41.7 %       [>25]            Min. IVC: nm*                    SWT: 1.1 cm       [0.6-1.0]    ------------------                    PWT: 0.75 cm      [0.6-1.0]    nm* - not measured  LEFT ATRIUM                LA Diam: 3.7 cm       [3.0-4.0]            LA A4C Area: 26.9 cm2     [< 20]              LA Volume: 78.5 ml      [18-58]  _________________________________________________________________________________________  ECHOCARDIOGRAPHIC DESCRIPTIONS  AORTIC ROOT                   Size: Normal             Dissection: INDETERM FOR DISSECTION  AORTIC VALVE               Leaflets: BIOPROSTHETIC               Morphology: MILDLY THICKENED               Mobility: Fully mobile               AOV Note: EDWARDS 68m  LEFT VENTRICLE                   Size: Normal                        Anterior: Normal            Contraction: Normal                         Lateral: Normal             Closest EF: >55% (Estimated)                Septal: Normal              LV Masses: No Masses  Apical: Normal                    LVH: MILD LVH ASYMMETRIC           Inferior: Normal                                                      Posterior: Normal           Dias.FxClass: Normal  MITRAL VALVE               Leaflets: Normal                        Mobility: Fully mobile             Morphology: THICKENED LEAFLET(S)  LEFT ATRIUM                   Size: MODERATELY ENLARGED          LA Masses: No masses              IA Septum: Normal IAS  MAIN PA                   Size: MODERATELY DILATED  PULMONIC VALVE              Morphology: Normal                        Mobility: Fully mobile  RIGHT VENTRICLE              RV Masses: No Masses                         Size: Normal              Free Wall: Normal                     Contraction: Normal                     TAPSE = 1.8 cm Normal Range [>= 1.6cm]  TRICUSPID VALVE               Leaflets: Normal                        Mobility: Fully mobile             Morphology: Normal  RIGHT ATRIUM                   Size: Normal                        RA Other: None                RA Mass: No masses  PERICARDIUM                  Fluid: No effusion  INFERIOR VENACAVA                   Size: Normal Normal respiratory collapse  _________________________________________________________________________________________   DOPPLER ECHO and OTHER SPECIAL PROCEDURES                 Aortic:  MILD AR                    BIOPROSTHETIC AoV                         326.0 cm/sec peak vel      42.5 mmHg peak grad                         20.5 mmHg mean grad        1.3 cm^2 by DOPPLER                 Mitral: MILD MR                    No MS                         MV Inflow E Vel = 91.5 cm/sec       MV Annulus E'Vel = 10.0 cm/sec                         E/E'Ratio = 9.2              Tricuspid: MILD TR                    No TS                         209.0 cm/sec peak TR vel   20.5 mmHg peak RV pressure              Pulmonary: MODERATE PR                No PS                         105.0 cm/sec peak vel      4.4 mmHg peak grad  _________________________________________________________________________________________  INTERPRETATION  NORMAL LEFT VENTRICULAR SYSTOLIC FUNCTION   WITH MILD LVH  NORMAL RIGHT VENTRICULAR SYSTOLIC FUNCTION  MODERATE VALVULAR REGURGITATION (See above)  NO VALVULAR STENOSIS  ESTIMATED LVEF >55%  Calculated: 61.3%  GLS: -17.1%  Aortic: MILD AI; BIOPROSTHETIC AOV: EDWARDS 66m - FULLY FUNCTIONAL, NO VISUAL  ABNORMALITIES  Mitral: MILD MR  Tricuspid: MILD  TR (2.114m; RVSP:  Pulmonic: MODERATE PI  MODERATE LAE  MODERATELY DILATED PULMONARY ARTERY MEASURING 2.6cm  _________________________________________________________________________________________  Electronically signed by      MD AlMiquel Dunnn 04/22/2022 04: 5615M           Performed By: HaDerinda Late   Ordering Physician: PaSaralyn PilarMD AlQuita Skyeeviewed by me (LT) 09/11/2022 : Sinus bradycardia rate 50s, increased to sinus rhythm in the 60s with conversation, occasional PVCs  EKG reviewed by me: EKG shows sinus rhythm with premature atrial contractions and a first-degree AV block rate 62, repeat shows sinus bradycardia with rate 57 and occasional PVCs  Data reviewed by me (LT) 09/11/2022: Last cardiology note from 10/9, ED note, admission H&P hospitalist progress note, prior echo from 03/2022, CBC, BMP, troponins, BNP's  Principal Problem:   Acute CHF (congestive heart failure) ([place]Active Problems:   Dyslipidemia   Hx of aortic valve replacement   BPH (benign prostatic hyperplasia)   Chest pain   Hypotension    ASSESSMENT AND PLAN:  LyJeani Hawking  Nain Rudd is a 61 year old male with a PMH of 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 Ysidro Evert, [facility]), paroxysmal atrial fibrillation (1% burden 7-day Holter monitor) not on chronic AC (CHA2DS2-VASc 0) who presented to [facility] ED the evening of 09/09/2022 with chest pain, dyspnea on exertion, and new exercise intolerance (very recently able to walk 3 miles at a brisk pace).  He presents in acute heart failure with an AKI.  Echo this admission with new moderate aortic insufficiency, concerning for failure of his AVR.  Cardiology is formally consulted on 10/19 for further assistance.  #Acute heart failure, h/o bicuspid AV s/p Carpentier Edwards bovine AVR 2007 with acute aortic insufficiency Presents with new exercise intolerance 2 days prior to admission (previously running 4-5 miles, 9-minute mile pace), dyspnea on exertion, new  orthopnea and hypotension (home BP 70/40).  BNP elevated at 1100 on admission with an oxygen requirement of 2 L.  Echo shows preserved EF at 60-65% with grade 3 diastolic dysfunction, and moderate aortic insufficiency.  On physical exam he has a loud diastolic knocking murmur.  This constellation of findings is concerning for acute aortic insufficiency, for which further surgical evaluation with CT surgery at [facility] is recommended.  -Continue midodrine for blood pressure support, may need further support with IV inotropes if he decompensates further. -Hold metoprolol tartrate -Continue aspirin 81 mg, atorvastatin 10 mg -Hold further IV diuresis for now -He may need a TEE, cMRI for further evaluation, but due to the acute onset of his aortic insufficiency, transfer to a tertiary care facility that has CT surgery is recommended. -Dr. Lujean Amel contacted the [facility] transfer center this morning at 0830, power sharing his TTE images to [facility]  #AKI Renal function on admission BUN/creatinine 54/1.93 and GFR 39, with slight improvement after IV diuresis with BUN/creatinine 46/1.66 and GFR 47.  Likely congestion from acute heart failure.  #Elevated troponin Elevated with a flat trend most consistent with demand/supply mismatch and not ACS   This patient's plan of care was discussed and created with Dr. Clayborn Bigness and he is in agreement.  Signed: Tristan Schroeder , PA-C 09/11/2022, 7:34 AM [facility] Cardiology

## 2022-09-11 NOTE — ED Notes (Signed)
Powershared images to [place]

## 2022-09-11 NOTE — Progress Notes (Signed)
       CROSS COVER NOTE  NAME: Antonio [name] MRN: [id number] DOB : Mar 16, 1961 ATTENDING PHYSICIAN: '@ATTENDING'$ @    Date of Service   09/11/2022   HPI/Events of Note   Notified of down-trending BP this evening. BP 89/42 MAP 57. Patient is currently asymptomatic.  Interventions   Assessment/Plan:  Midodrine x1     This document was prepared using Dragon voice recognition software and may include unintentional dictation errors.  Neomia Glass DNP, MBA, FNP-BC Nurse Practitioner [facility] Pager [phone]

## 2022-09-14 LAB — CULTURE, BLOOD (ROUTINE X 2)
Culture: NO GROWTH
Culture: NO GROWTH
Special Requests: ADEQUATE
Special Requests: ADEQUATE

## 2022-09-17 ENCOUNTER — Ambulatory Visit: Payer: Managed Care, Other (non HMO) | Admitting: Family

## 2022-09-25 DIAGNOSIS — Z8679 Personal history of other diseases of the circulatory system: Secondary | ICD-10-CM | POA: Insufficient documentation

## 2022-10-20 ENCOUNTER — Encounter: Payer: Managed Care, Other (non HMO) | Admitting: *Deleted

## 2022-10-20 ENCOUNTER — Encounter: Payer: Managed Care, Other (non HMO) | Attending: Cardiology | Admitting: *Deleted

## 2022-10-20 VITALS — Ht 70.0 in | Wt 166.1 lb

## 2022-10-20 DIAGNOSIS — Z952 Presence of prosthetic heart valve: Secondary | ICD-10-CM | POA: Diagnosis present

## 2022-10-20 NOTE — Progress Notes (Signed)
Cardiac Individual Treatment Plan  Patient Details  Name: Antonio [name] MRN: [id number] Date of Birth: May 25, 1961 Referring Provider:   Flowsheet Row Cardiac Rehab from 10/20/2022 in [facility] Cardiac and Pulmonary Rehab  Referring Provider Paraschos       Initial Encounter Date:  Flowsheet Row Cardiac Rehab from 10/20/2022 in [facility] Cardiac and Pulmonary Rehab  Date 10/20/22       Visit Diagnosis: S/P AVR (aortic valve replacement)  Patient's Home Medications on Admission:  Current Outpatient Medications:    acetaminophen (TYLENOL) 500 MG tablet, Take 1,000 mg by mouth every 6 (six) hours as needed (for pain.)., Disp: , Rfl:    aspirin EC 81 MG tablet, Take 81 mg by mouth daily., Disp: , Rfl:    atorvastatin (LIPITOR) 10 MG tablet, Take 10 mg by mouth every evening. , Disp: , Rfl:    cetirizine (ZYRTEC) 10 MG tablet, Take 10 mg by mouth daily., Disp: , Rfl:    loratadine (CLARITIN) 10 MG tablet, Take by mouth., Disp: , Rfl:    midodrine (PROAMATINE) 10 MG tablet, Take 1 tablet (10 mg total) by mouth 3 (three) times daily with meals., Disp: , Rfl:    Polyethyl Glycol-Propyl Glycol (LUBRICANT EYE DROPS) 0.4-0.3 % SOLN, Place 1 drop into both eyes 3 (three) times daily as needed (dry/irritated eyes.)., Disp: , Rfl:    tadalafil (CIALIS) 5 MG tablet, Take 1-3 tablets (5-15 mg total) by mouth daily as needed for erectile dysfunction., Disp: 30 tablet, Rfl: 11   tamsulosin (FLOMAX) 0.4 MG CAPS capsule, TAKE 1 CAPSULE(0.4 MG) BY MOUTH DAILY AFTER SUPPER, Disp: 90 capsule, Rfl: 3   traZODone (DESYREL) 50 MG tablet, Take 0.5 tablets (25 mg total) by mouth at bedtime as needed for sleep., Disp: , Rfl:    warfarin (COUMADIN) 2 MG tablet, Take 2 mg by mouth., Disp: , Rfl:   Past Medical History: Past Medical History:  Diagnosis Date   Aortic aneurysm ([place])    Aortic insufficiency 07/12/2014   Overview:  Mild to moderate   H/O bicuspid aortic valve 11/04/2013   Heart palpitations 05/15/2017    Hypertension    Metabolic syndrome 01/28/6282   OSA (obstructive sleep apnea) 06/11/2018   no CPAP/None suggested   Premature ventricular contractions 05/28/2017   S/P aortic valve replacement with bioprosthetic valve 11/04/2013   Overview:  With carpentier-edwards bovine pericardial valve and aortic aneurysm repair 03/26/06 by Dr Ysidro Evert at [facility]    Tobacco Use: Social History   Tobacco Use  Smoking Status Never  Smokeless Tobacco Never    Labs: Review Flowsheet        No data to display           Exercise Target Goals: Exercise Program Goal: Individual exercise prescription set using results from initial 6 min walk test and THRR while considering  patient's activity barriers and safety.   Exercise Prescription Goal: Initial exercise prescription builds to 30-45 minutes a day of aerobic activity, 2-3 days per week.  Home exercise guidelines will be given to patient during program as part of exercise prescription that the participant will acknowledge.   Education: Aerobic Exercise: - Group verbal and visual presentation on the components of exercise prescription. Introduces F.I.T.T principle from ACSM for exercise prescriptions.  Reviews F.I.T.T. principles of aerobic exercise including progression. Written material given at graduation.   Education: Resistance Exercise: - Group verbal and visual presentation on the components of exercise prescription. Introduces F.I.T.T principle from ACSM for exercise prescriptions  Reviews  F.I.T.T. principles of resistance exercise including progression. Written material given at graduation.    Education: Exercise & Equipment Safety: - Individual verbal instruction and demonstration of equipment use and safety with use of the equipment. Flowsheet Row Cardiac Rehab from 10/20/2022 in [facility] Cardiac and Pulmonary Rehab  Date 10/20/22  Educator [facility]  Instruction Review Code 1- Verbalizes Understanding       Education: Exercise Physiology &  General Exercise Guidelines: - Group verbal and written instruction with models to review the exercise physiology of the cardiovascular system and associated critical values. Provides general exercise guidelines with specific guidelines to those with heart or lung disease.    Education: Flexibility, Balance, Mind/Body Relaxation: - Group verbal and visual presentation with interactive activity on the components of exercise prescription. Introduces F.I.T.T principle from ACSM for exercise prescriptions. Reviews F.I.T.T. principles of flexibility and balance exercise training including progression. Also discusses the mind body connection.  Reviews various relaxation techniques to help reduce and manage stress (i.e. Deep breathing, progressive muscle relaxation, and visualization). Balance handout provided to take home. Written material given at graduation.   Activity Barriers & Risk Stratification:  Activity Barriers & Cardiac Risk Stratification - 10/20/22 1509       Activity Barriers & Cardiac Risk Stratification   Activity Barriers None    Cardiac Risk Stratification Moderate             6 Minute Walk:  6 Minute Walk     Row Name 10/20/22 1508         6 Minute Walk   Phase Initial     Distance 1320 feet     Walk Time 6 minutes     # of Rest Breaks 0     MPH 2.5     METS 3.85     RPE 11     Perceived Dyspnea  0     VO2 Peak 13.49     Symptoms No     Resting HR 78 bpm     Resting BP 136/86     Resting Oxygen Saturation  98 %     Exercise Oxygen Saturation  during 6 min walk 100 %     Max Ex. HR 104 bpm     Max Ex. BP 138/70     2 Minute Post BP 120/80              Oxygen Initial Assessment:   Oxygen Re-Evaluation:   Oxygen Discharge (Final Oxygen Re-Evaluation):   Initial Exercise Prescription:  Initial Exercise Prescription - 10/20/22 1500       Date of Initial Exercise RX and Referring Provider   Date 10/20/22    Referring Provider Paraschos       Oxygen   Maintain Oxygen Saturation 88% or higher      Treadmill   MPH 2.7    Grade 1    Minutes 15    METs 3.44      NuStep   Level 3    SPM 80    Minutes 15    METs 3.85      REL-XR   Level 2    Speed 50    Minutes 15    METs 3.85      Biostep-RELP   Level 2    SPM 50    Minutes 15    METs 3.85      Prescription Details   Frequency (times per week) 3    Duration Progress to 30 minutes of  continuous aerobic without signs/symptoms of physical distress      Intensity   THRR 40-80% of Max Heartrate 110-143    Ratings of Perceived Exertion 11-13    Perceived Dyspnea 0-4      Progression   Progression Continue to progress workloads to maintain intensity without signs/symptoms of physical distress.      Resistance Training   Training Prescription Yes    Weight 3    Reps 10-15             Perform Capillary Blood Glucose checks as needed.  Exercise Prescription Changes:   Exercise Prescription Changes     Row Name 10/20/22 1500             Response to Exercise   Blood Pressure (Admit) 136/86       Blood Pressure (Exercise) 138/70       Blood Pressure (Exit) 120/80       Heart Rate (Admit) 78 bpm       Heart Rate (Exercise) 104 bpm       Heart Rate (Exit) 83 bpm       Oxygen Saturation (Admit) 98 %       Oxygen Saturation (Exercise) 100 %       Oxygen Saturation (Exit) 100 %       Rating of Perceived Exertion (Exercise) 11       Perceived Dyspnea (Exercise) 0       Symptoms none       Comments 6 MWT results                Exercise Comments:   Exercise Goals and Review:   Exercise Goals     Row Name 10/20/22 1516             Exercise Goals   Increase Physical Activity Yes       Intervention Provide advice, education, support and counseling about physical activity/exercise needs.;Develop an individualized exercise prescription for aerobic and resistive training based on initial evaluation findings, risk stratification,  comorbidities and participant's personal goals.       Expected Outcomes Short Term: Attend rehab on a regular basis to increase amount of physical activity.;Long Term: Add in home exercise to make exercise part of routine and to increase amount of physical activity.;Long Term: Exercising regularly at least 3-5 days a week.       Increase Strength and Stamina Yes       Intervention Provide advice, education, support and counseling about physical activity/exercise needs.;Develop an individualized exercise prescription for aerobic and resistive training based on initial evaluation findings, risk stratification, comorbidities and participant's personal goals.       Expected Outcomes Short Term: Increase workloads from initial exercise prescription for resistance, speed, and METs.;Short Term: Perform resistance training exercises routinely during rehab and add in resistance training at home;Long Term: Improve cardiorespiratory fitness, muscular endurance and strength as measured by increased METs and functional capacity (6MWT)       Able to understand and use rate of perceived exertion (RPE) scale Yes       Intervention Provide education and explanation on how to use RPE scale       Expected Outcomes Short Term: Able to use RPE daily in rehab to express subjective intensity level;Long Term:  Able to use RPE to guide intensity level when exercising independently       Able to understand and use Dyspnea scale Yes       Intervention Provide education and  explanation on how to use Dyspnea scale       Expected Outcomes Short Term: Able to use Dyspnea scale daily in rehab to express subjective sense of shortness of breath during exertion;Long Term: Able to use Dyspnea scale to guide intensity level when exercising independently       Knowledge and understanding of Target Heart Rate Range (THRR) Yes       Intervention Provide education and explanation of THRR including how the numbers were predicted and where they  are located for reference       Expected Outcomes Short Term: Able to state/look up THRR;Long Term: Able to use THRR to govern intensity when exercising independently;Short Term: Able to use daily as guideline for intensity in rehab       Able to check pulse independently Yes       Intervention Provide education and demonstration on how to check pulse in carotid and radial arteries.;Review the importance of being able to check your own pulse for safety during independent exercise       Expected Outcomes Short Term: Able to explain why pulse checking is important during independent exercise       Understanding of Exercise Prescription Yes       Intervention Provide education, explanation, and written materials on patient's individual exercise prescription       Expected Outcomes Short Term: Able to explain program exercise prescription;Long Term: Able to explain home exercise prescription to exercise independently                Exercise Goals Re-Evaluation :   Discharge Exercise Prescription (Final Exercise Prescription Changes):  Exercise Prescription Changes - 10/20/22 1500       Response to Exercise   Blood Pressure (Admit) 136/86    Blood Pressure (Exercise) 138/70    Blood Pressure (Exit) 120/80    Heart Rate (Admit) 78 bpm    Heart Rate (Exercise) 104 bpm    Heart Rate (Exit) 83 bpm    Oxygen Saturation (Admit) 98 %    Oxygen Saturation (Exercise) 100 %    Oxygen Saturation (Exit) 100 %    Rating of Perceived Exertion (Exercise) 11    Perceived Dyspnea (Exercise) 0    Symptoms none    Comments 6 MWT results             Nutrition:  Target Goals: Understanding of nutrition guidelines, daily intake of sodium '1500mg'$ , cholesterol '200mg'$ , calories 30% from fat and 7% or less from saturated fats, daily to have 5 or more servings of fruits and vegetables.  Education: All About Nutrition: -Group instruction provided by verbal, written material, interactive activities,  discussions, models, and posters to present general guidelines for heart healthy nutrition including fat, fiber, MyPlate, the role of sodium in heart healthy nutrition, utilization of the nutrition label, and utilization of this knowledge for meal planning. Follow up email sent as well. Written material given at graduation.   Biometrics:  Pre Biometrics - 10/20/22 1517       Pre Biometrics   Height '5\' 10"'$  (1.778 m)    Weight 166 lb 1.6 oz (75.3 kg)    Waist Circumference 32 inches    Hip Circumference 38 inches    Waist to Hip Ratio 0.84 %    BMI (Calculated) 23.83    Single Leg Stand 30 seconds              Nutrition Therapy Plan and Nutrition Goals:  Nutrition Therapy & Goals -  10/20/22 1432       Personal Nutrition Goals   Comments B: 1 whole egg and 2 egg whites with spinach, blueberries, and a piece of bacon. Coffee with some splenda. L: chicken (air fry or bake) with a salad D: chicken and non-starchy vegetables. he uses some spray to keep it from sticking and some olive oil as well as spices. He has been limiting salt. Drinks: water or unsweet tea with 1 diet coke per day.      Intervention Plan   Intervention Prescribe, educate and counsel regarding individualized specific dietary modifications aiming towards targeted core components such as weight, hypertension, lipid management, diabetes, heart failure and other comorbidities.;Nutrition handout(s) given to patient.    Expected Outcomes Short Term Goal: Understand basic principles of dietary content, such as calories, fat, sodium, cholesterol and nutrients.;Short Term Goal: A plan has been developed with personal nutrition goals set during dietitian appointment.;Long Term Goal: Adherence to prescribed nutrition plan.             Nutrition Assessments:  MEDIFICTS Score Key: ?70 Need to make dietary changes  40-70 Heart Healthy Diet ? 40 Therapeutic Level Cholesterol Diet  Flowsheet Row Cardiac Rehab from  10/20/2022 in [facility] Cardiac and Pulmonary Rehab  Picture Your Plate Total Score on Admission 75      Picture Your Plate Scores: <39 Unhealthy dietary pattern with much room for improvement. 41-50 Dietary pattern unlikely to meet recommendations for good health and room for improvement. 51-60 More healthful dietary pattern, with some room for improvement.  >60 Healthy dietary pattern, although there may be some specific behaviors that could be improved.    Nutrition Goals Re-Evaluation:   Nutrition Goals Discharge (Final Nutrition Goals Re-Evaluation):   Psychosocial: Target Goals: Acknowledge presence or absence of significant depression and/or stress, maximize coping skills, provide positive support system. Participant is able to verbalize types and ability to use techniques and skills needed for reducing stress and depression.   Education: Stress, Anxiety, and Depression - Group verbal and visual presentation to define topics covered.  Reviews how body is impacted by stress, anxiety, and depression.  Also discusses healthy ways to reduce stress and to treat/manage anxiety and depression.  Written material given at graduation.   Education: Sleep Hygiene -Provides group verbal and written instruction about how sleep can affect your health.  Define sleep hygiene, discuss sleep cycles and impact of sleep habits. Review good sleep hygiene tips.    Initial Review & Psychosocial Screening:  Initial Psych Review & Screening - 10/20/22 1010       Initial Review   Current issues with None Identified      Family Dynamics   Good Support System? Yes   wife, children, family     Barriers   Psychosocial barriers to participate in program There are no identifiable barriers or psychosocial needs.;The patient should benefit from training in stress management and relaxation.      Screening Interventions   Interventions Encouraged to exercise;Provide feedback about the scores to  participant;To provide support and resources with identified psychosocial needs    Expected Outcomes Short Term goal: Utilizing psychosocial counselor, staff and physician to assist with identification of specific Stressors or current issues interfering with healing process. Setting desired goal for each stressor or current issue identified.;Long Term Goal: Stressors or current issues are controlled or eliminated.;Short Term goal: Identification and review with participant of any Quality of Life or Depression concerns found by scoring the questionnaire.;Long Term goal: The participant  improves quality of Life and PHQ9 Scores as seen by post scores and/or verbalization of changes             Quality of Life Scores:   Quality of Life - 10/20/22 1526       Quality of Life   Select Quality of Life      Quality of Life Scores   Health/Function Pre 26.67 %    Socioeconomic Pre 27.86 %    Psych/Spiritual Pre 29.29 %    Family Pre 27.5 %    GLOBAL Pre 27.57 %            Scores of 19 and below usually indicate a poorer quality of life in these areas.  A difference of  2-3 points is a clinically meaningful difference.  A difference of 2-3 points in the total score of the Quality of Life Index has been associated with significant improvement in overall quality of life, self-image, physical symptoms, and general health in studies assessing change in quality of life.  PHQ-9: Review Flowsheet       10/20/2022  Depression screen PHQ 2/9  Decreased Interest 0  Down, Depressed, Hopeless 0  PHQ - 2 Score 0  Altered sleeping 0  Tired, decreased energy 0  Change in appetite 0  Feeling bad or failure about yourself  0  Trouble concentrating 0  Moving slowly or fidgety/restless 0  Suicidal thoughts 0  PHQ-9 Score 0  Difficult doing work/chores Not difficult at all   Interpretation of Total Score  Total Score Depression Severity:  1-4 = Minimal depression, 5-9 = Mild depression, 10-14 =  Moderate depression, 15-19 = Moderately severe depression, 20-27 = Severe depression   Psychosocial Evaluation and Intervention:  Psychosocial Evaluation - 10/20/22 1020       Psychosocial Evaluation & Interventions   Interventions Encouraged to exercise with the program and follow exercise prescription    Comments Mr. Nuttall is coming to cardiac rehab post AVR. He had one done in 2007 so he is familiar with the program and process. He reports no stress concerns or sleep issues. He has a very supporting family and friend circle. He states recovery has been smooth so far. He is quite active and wants to get back to exercising like he previously was.    Expected Outcomes Short: attend cardiac rehab for education and exercise. Long: develop and maintian positive self care habits.    Continue Psychosocial Services  Follow up required by staff             Psychosocial Re-Evaluation:   Psychosocial Discharge (Final Psychosocial Re-Evaluation):   Vocational Rehabilitation: Provide vocational rehab assistance to qualifying candidates.   Vocational Rehab Evaluation & Intervention:  Vocational Rehab - 10/20/22 1010       Initial Vocational Rehab Evaluation & Intervention   Assessment shows need for Vocational Rehabilitation No             Education: Education Goals: Education classes will be provided on a variety of topics geared toward better understanding of heart health and risk factor modification. Participant will state understanding/return demonstration of topics presented as noted by education test scores.  Learning Barriers/Preferences:  Learning Barriers/Preferences - 10/20/22 1020       Learning Barriers/Preferences   Learning Barriers None    Learning Preferences None             General Cardiac Education Topics:  AED/CPR: - Group verbal and written instruction with the use of models  to demonstrate the basic use of the AED with the basic ABC's of  resuscitation.   Anatomy and Cardiac Procedures: - Group verbal and visual presentation and models provide information about basic cardiac anatomy and function. Reviews the testing methods done to diagnose heart disease and the outcomes of the test results. Describes the treatment choices: Medical Management, Angioplasty, or Coronary Bypass Surgery for treating various heart conditions including Myocardial Infarction, Angina, Valve Disease, and Cardiac Arrhythmias.  Written material given at graduation. Flowsheet Row Cardiac Rehab from 10/20/2022 in [facility] Cardiac and Pulmonary Rehab  Education need identified 10/20/22       Medication Safety: - Group verbal and visual instruction to review commonly prescribed medications for heart and lung disease. Reviews the medication, class of the drug, and side effects. Includes the steps to properly store meds and maintain the prescription regimen.  Written material given at graduation.   Intimacy: - Group verbal instruction through game format to discuss how heart and lung disease can affect sexual intimacy. Written material given at graduation..   Know Your Numbers and Heart Failure: - Group verbal and visual instruction to discuss disease risk factors for cardiac and pulmonary disease and treatment options.  Reviews associated critical values for Overweight/Obesity, Hypertension, Cholesterol, and Diabetes.  Discusses basics of heart failure: signs/symptoms and treatments.  Introduces Heart Failure Zone chart for action plan for heart failure.  Written material given at graduation.   Infection Prevention: - Provides verbal and written material to individual with discussion of infection control including proper hand washing and proper equipment cleaning during exercise session. Flowsheet Row Cardiac Rehab from 10/20/2022 in [facility] Cardiac and Pulmonary Rehab  Date 10/20/22  Educator [facility]  Instruction Review Code 1- Verbalizes Understanding        Falls Prevention: - Provides verbal and written material to individual with discussion of falls prevention and safety. Flowsheet Row Cardiac Rehab from 10/20/2022 in [facility] Cardiac and Pulmonary Rehab  Date 10/20/22  Educator [facility]  Instruction Review Code 1- Verbalizes Understanding       Other: -Provides group and verbal instruction on various topics (see comments)   Knowledge Questionnaire Score:  Knowledge Questionnaire Score - 10/20/22 1526       Knowledge Questionnaire Score   Pre Score 25/26             Core Components/Risk Factors/Patient Goals at Admission:  Personal Goals and Risk Factors at Admission - 10/20/22 1527       Core Components/Risk Factors/Patient Goals on Admission    Weight Management Yes    Intervention Weight Management: Develop a combined nutrition and exercise program designed to reach desired caloric intake, while maintaining appropriate intake of nutrient and fiber, sodium and fats, and appropriate energy expenditure required for the weight goal.;Weight Management: Provide education and appropriate resources to help participant work on and attain dietary goals.    Admit Weight 166 lb 1.6 oz (75.3 kg)    Goal Weight: Short Term 166 lb (75.3 kg)    Goal Weight: Long Term 166 lb (75.3 kg)    Expected Outcomes Short Term: Continue to assess and modify interventions until short term weight is achieved;Long Term: Adherence to nutrition and physical activity/exercise program aimed toward attainment of established weight goal;Weight Maintenance: Understanding of the daily nutrition guidelines, which includes 25-35% calories from fat, 7% or less cal from saturated fats, less than '200mg'$  cholesterol, less than 1.5gm of sodium, & 5 or more servings of fruits and vegetables daily;Understanding recommendations for meals to  include 15-35% energy as protein, 25-35% energy from fat, 35-60% energy from carbohydrates, less than '200mg'$  of dietary cholesterol, 20-35 gm  of total fiber daily;Understanding of distribution of calorie intake throughout the day with the consumption of 4-5 meals/snacks    Hypertension Yes    Intervention Provide education on lifestyle modifcations including regular physical activity/exercise, weight management, moderate sodium restriction and increased consumption of fresh fruit, vegetables, and low fat dairy, alcohol moderation, and smoking cessation.;Monitor prescription use compliance.    Expected Outcomes Short Term: Continued assessment and intervention until BP is < 140/84m HG in hypertensive participants. < 130/859mHG in hypertensive participants with diabetes, heart failure or chronic kidney disease.;Long Term: Maintenance of blood pressure at goal levels.    Lipids Yes    Intervention Provide education and support for participant on nutrition & aerobic/resistive exercise along with prescribed medications to achieve LDL '70mg'$ , HDL >'40mg'$ .    Expected Outcomes Short Term: Participant states understanding of desired cholesterol values and is compliant with medications prescribed. Participant is following exercise prescription and nutrition guidelines.;Long Term: Cholesterol controlled with medications as prescribed, with individualized exercise RX and with personalized nutrition plan. Value goals: LDL < '70mg'$ , HDL > 40 mg.             Education:Diabetes - Individual verbal and written instruction to review signs/symptoms of diabetes, desired ranges of glucose level fasting, after meals and with exercise. Acknowledge that pre and post exercise glucose checks will be done for 3 sessions at entry of program.   Core Components/Risk Factors/Patient Goals Review:    Core Components/Risk Factors/Patient Goals at Discharge (Final Review):    ITP Comments:  ITP Comments     Row Name 10/20/22 1019 10/20/22 1506         ITP Comments Initial telephone orientation completed. [facility] EP orientation  scheduled for Monday 11/27 at 1:30 Completed 6MWT and gym orientation. Initial ITP created and sent for review to Dr. MaEmily FilbertMedical Director.               Comments: initial ITP

## 2022-10-20 NOTE — Progress Notes (Signed)
Initial telephone orientation completed. Diagnosis can be found in [facility] 10/19. EP orientation scheduled for Monday 11/27 at 1:30

## 2022-10-20 NOTE — Patient Instructions (Signed)
Patient Instructions  Patient Details  Name: Antonio [name] MRN: [id number] Date of Birth: 1961-10-31 Referring Provider:  Isaias Cowman, MD  Below are your personal goals for exercise, nutrition, and risk factors. Our goal is to help you stay on track towards obtaining and maintaining these goals. We will be discussing your progress on these goals with you throughout the program.  Initial Exercise Prescription:  Initial Exercise Prescription - 10/20/22 1500       Date of Initial Exercise RX and Referring Provider   Date 10/20/22    Referring Provider Paraschos      Oxygen   Maintain Oxygen Saturation 88% or higher      Treadmill   MPH 2.7    Grade 1    Minutes 15    METs 3.44      NuStep   Level 3    SPM 80    Minutes 15    METs 3.85      REL-XR   Level 2    Speed 50    Minutes 15    METs 3.85      Biostep-RELP   Level 2    SPM 50    Minutes 15    METs 3.85      Prescription Details   Frequency (times per week) 3    Duration Progress to 30 minutes of continuous aerobic without signs/symptoms of physical distress      Intensity   THRR 40-80% of Max Heartrate 110-143    Ratings of Perceived Exertion 11-13    Perceived Dyspnea 0-4      Progression   Progression Continue to progress workloads to maintain intensity without signs/symptoms of physical distress.      Resistance Training   Training Prescription Yes    Weight 3    Reps 10-15             Exercise Goals: Frequency: Be able to perform aerobic exercise two to three times per week in program working toward 2-5 days per week of home exercise.  Intensity: Work with a perceived exertion of 11 (fairly light) - 15 (hard) while following your exercise prescription.  We will make changes to your prescription with you as you progress through the program.   Duration: Be able to do 30 to 45 minutes of continuous aerobic exercise in addition to a 5 minute warm-up and a 5 minute cool-down routine.    Nutrition Goals: Your personal nutrition goals will be established when you do your nutrition analysis with the dietician.  The following are general nutrition guidelines to follow: Cholesterol < '200mg'$ /day Sodium < '1500mg'$ /day Fiber: Men over 50 yrs - 30 grams per day  Personal Goals:  Personal Goals and Risk Factors at Admission - 10/20/22 1527       Core Components/Risk Factors/Patient Goals on Admission    Weight Management Yes    Intervention Weight Management: Develop a combined nutrition and exercise program designed to reach desired caloric intake, while maintaining appropriate intake of nutrient and fiber, sodium and fats, and appropriate energy expenditure required for the weight goal.;Weight Management: Provide education and appropriate resources to help participant work on and attain dietary goals.    Admit Weight 166 lb 1.6 oz (75.3 kg)    Goal Weight: Short Term 166 lb (75.3 kg)    Goal Weight: Long Term 166 lb (75.3 kg)    Expected Outcomes Short Term: Continue to assess and modify interventions until short term weight is achieved;Long Term: Adherence  to nutrition and physical activity/exercise program aimed toward attainment of established weight goal;Weight Maintenance: Understanding of the daily nutrition guidelines, which includes 25-35% calories from fat, 7% or less cal from saturated fats, less than '200mg'$  cholesterol, less than 1.5gm of sodium, & 5 or more servings of fruits and vegetables daily;Understanding recommendations for meals to include 15-35% energy as protein, 25-35% energy from fat, 35-60% energy from carbohydrates, less than '200mg'$  of dietary cholesterol, 20-35 gm of total fiber daily;Understanding of distribution of calorie intake throughout the day with the consumption of 4-5 meals/snacks    Hypertension Yes    Intervention Provide education on lifestyle modifcations including regular physical activity/exercise, weight management, moderate sodium restriction and  increased consumption of fresh fruit, vegetables, and low fat dairy, alcohol moderation, and smoking cessation.;Monitor prescription use compliance.    Expected Outcomes Short Term: Continued assessment and intervention until BP is < 140/20m HG in hypertensive participants. < 130/89mHG in hypertensive participants with diabetes, heart failure or chronic kidney disease.;Long Term: Maintenance of blood pressure at goal levels.    Lipids Yes    Intervention Provide education and support for participant on nutrition & aerobic/resistive exercise along with prescribed medications to achieve LDL '70mg'$ , HDL >'40mg'$ .    Expected Outcomes Short Term: Participant states understanding of desired cholesterol values and is compliant with medications prescribed. Participant is following exercise prescription and nutrition guidelines.;Long Term: Cholesterol controlled with medications as prescribed, with individualized exercise RX and with personalized nutrition plan. Value goals: LDL < '70mg'$ , HDL > 40 mg.             Tobacco Use Initial Evaluation: Social History   Tobacco Use  Smoking Status Never  Smokeless Tobacco Never    Exercise Goals and Review:  Exercise Goals     Row Name 10/20/22 1516             Exercise Goals   Increase Physical Activity Yes       Intervention Provide advice, education, support and counseling about physical activity/exercise needs.;Develop an individualized exercise prescription for aerobic and resistive training based on initial evaluation findings, risk stratification, comorbidities and participant's personal goals.       Expected Outcomes Short Term: Attend rehab on a regular basis to increase amount of physical activity.;Long Term: Add in home exercise to make exercise part of routine and to increase amount of physical activity.;Long Term: Exercising regularly at least 3-5 days a week.       Increase Strength and Stamina Yes       Intervention Provide advice,  education, support and counseling about physical activity/exercise needs.;Develop an individualized exercise prescription for aerobic and resistive training based on initial evaluation findings, risk stratification, comorbidities and participant's personal goals.       Expected Outcomes Short Term: Increase workloads from initial exercise prescription for resistance, speed, and METs.;Short Term: Perform resistance training exercises routinely during rehab and add in resistance training at home;Long Term: Improve cardiorespiratory fitness, muscular endurance and strength as measured by increased METs and functional capacity (6MWT)       Able to understand and use rate of perceived exertion (RPE) scale Yes       Intervention Provide education and explanation on how to use RPE scale       Expected Outcomes Short Term: Able to use RPE daily in rehab to express subjective intensity level;Long Term:  Able to use RPE to guide intensity level when exercising independently       Able to understand  and use Dyspnea scale Yes       Intervention Provide education and explanation on how to use Dyspnea scale       Expected Outcomes Short Term: Able to use Dyspnea scale daily in rehab to express subjective sense of shortness of breath during exertion;Long Term: Able to use Dyspnea scale to guide intensity level when exercising independently       Knowledge and understanding of Target Heart Rate Range (THRR) Yes       Intervention Provide education and explanation of THRR including how the numbers were predicted and where they are located for reference       Expected Outcomes Short Term: Able to state/look up THRR;Long Term: Able to use THRR to govern intensity when exercising independently;Short Term: Able to use daily as guideline for intensity in rehab       Able to check pulse independently Yes       Intervention Provide education and demonstration on how to check pulse in carotid and radial arteries.;Review the  importance of being able to check your own pulse for safety during independent exercise       Expected Outcomes Short Term: Able to explain why pulse checking is important during independent exercise       Understanding of Exercise Prescription Yes       Intervention Provide education, explanation, and written materials on patient's individual exercise prescription       Expected Outcomes Short Term: Able to explain program exercise prescription;Long Term: Able to explain home exercise prescription to exercise independently                Copy of goals given to participant.

## 2022-10-22 ENCOUNTER — Encounter: Payer: Self-pay | Admitting: *Deleted

## 2022-10-22 ENCOUNTER — Encounter: Payer: Managed Care, Other (non HMO) | Admitting: *Deleted

## 2022-10-22 DIAGNOSIS — Z952 Presence of prosthetic heart valve: Secondary | ICD-10-CM

## 2022-10-22 NOTE — Progress Notes (Signed)
Daily Session Note  Patient Details  Name: Antonio [name] MRN: [id number] Date of Birth: 11/03/1961 Referring Provider:   Flowsheet Row Cardiac Rehab from 10/20/2022 in [facility]  Referring Provider Paraschos       Encounter Date: 10/22/2022  Check In:  Session Check In - 10/22/22 [place]       Check-In   Supervising physician immediately available to respond to emergencies See telemetry face sheet for immediately available ER MD    Location [facility]-Cardiac & Pulmonary [facility]    Staff Present Justin Mend, RCP,RRT,BSRT;Shiron Whetsel Sherryll Burger, RN Moises Blood, BS, ACSM CEP, Exercise Physiologist    Virtual Visit No    Medication changes reported     No    Fall or balance concerns reported    No    Warm-up and Cool-down Performed on first and last piece of equipment    Resistance Training Performed Yes    VAD Patient? No    PAD/SET Patient? No      Pain Assessment   Currently in Pain? No/denies                Social History   Tobacco Use  Smoking Status Never  Smokeless Tobacco Never    Goals Met:  Independence with exercise equipment Exercise tolerated well No report of concerns or symptoms today Strength training completed today  Goals Unmet:  Not Applicable  Comments: First full day of exercise!  Patient was oriented to gym and equipment including functions, settings, policies, and procedures.  Patient's individual exercise prescription and treatment plan were reviewed.  All starting workloads were established based on the results of the 6 minute walk test done at initial orientation visit.  The plan for exercise progression was also introduced and progression will be customized based on patient's performance and goals.     Dr. Emily Filbert is Medical Director for [place].  Dr. Ottie Glazier is Medical Director for [facility].

## 2022-10-22 NOTE — Progress Notes (Signed)
Cardiac Individual Treatment Plan  Patient Details  Name: Antonio [name] MRN: [id number] Date of Birth: May 25, 1961 Referring Provider:   Flowsheet Row Cardiac Rehab from 10/20/2022 in [facility] Cardiac and Pulmonary Rehab  Referring Provider Paraschos       Initial Encounter Date:  Flowsheet Row Cardiac Rehab from 10/20/2022 in [facility] Cardiac and Pulmonary Rehab  Date 10/20/22       Visit Diagnosis: S/P AVR (aortic valve replacement)  Patient's Home Medications on Admission:  Current Outpatient Medications:    acetaminophen (TYLENOL) 500 MG tablet, Take 1,000 mg by mouth every 6 (six) hours as needed (for pain.)., Disp: , Rfl:    aspirin EC 81 MG tablet, Take 81 mg by mouth daily., Disp: , Rfl:    atorvastatin (LIPITOR) 10 MG tablet, Take 10 mg by mouth every evening. , Disp: , Rfl:    cetirizine (ZYRTEC) 10 MG tablet, Take 10 mg by mouth daily., Disp: , Rfl:    loratadine (CLARITIN) 10 MG tablet, Take by mouth., Disp: , Rfl:    midodrine (PROAMATINE) 10 MG tablet, Take 1 tablet (10 mg total) by mouth 3 (three) times daily with meals., Disp: , Rfl:    Polyethyl Glycol-Propyl Glycol (LUBRICANT EYE DROPS) 0.4-0.3 % SOLN, Place 1 drop into both eyes 3 (three) times daily as needed (dry/irritated eyes.)., Disp: , Rfl:    tadalafil (CIALIS) 5 MG tablet, Take 1-3 tablets (5-15 mg total) by mouth daily as needed for erectile dysfunction., Disp: 30 tablet, Rfl: 11   tamsulosin (FLOMAX) 0.4 MG CAPS capsule, TAKE 1 CAPSULE(0.4 MG) BY MOUTH DAILY AFTER SUPPER, Disp: 90 capsule, Rfl: 3   traZODone (DESYREL) 50 MG tablet, Take 0.5 tablets (25 mg total) by mouth at bedtime as needed for sleep., Disp: , Rfl:    warfarin (COUMADIN) 2 MG tablet, Take 2 mg by mouth., Disp: , Rfl:   Past Medical History: Past Medical History:  Diagnosis Date   Aortic aneurysm ([place])    Aortic insufficiency 07/12/2014   Overview:  Mild to moderate   H/O bicuspid aortic valve 11/04/2013   Heart palpitations 05/15/2017    Hypertension    Metabolic syndrome 01/28/6282   OSA (obstructive sleep apnea) 06/11/2018   no CPAP/None suggested   Premature ventricular contractions 05/28/2017   S/P aortic valve replacement with bioprosthetic valve 11/04/2013   Overview:  With carpentier-edwards bovine pericardial valve and aortic aneurysm repair 03/26/06 by Dr Ysidro Evert at [facility]    Tobacco Use: Social History   Tobacco Use  Smoking Status Never  Smokeless Tobacco Never    Labs: Review Flowsheet        No data to display           Exercise Target Goals: Exercise Program Goal: Individual exercise prescription set using results from initial 6 min walk test and THRR while considering  patient's activity barriers and safety.   Exercise Prescription Goal: Initial exercise prescription builds to 30-45 minutes a day of aerobic activity, 2-3 days per week.  Home exercise guidelines will be given to patient during program as part of exercise prescription that the participant will acknowledge.   Education: Aerobic Exercise: - Group verbal and visual presentation on the components of exercise prescription. Introduces F.I.T.T principle from ACSM for exercise prescriptions.  Reviews F.I.T.T. principles of aerobic exercise including progression. Written material given at graduation.   Education: Resistance Exercise: - Group verbal and visual presentation on the components of exercise prescription. Introduces F.I.T.T principle from ACSM for exercise prescriptions  Reviews  F.I.T.T. principles of resistance exercise including progression. Written material given at graduation.    Education: Exercise & Equipment Safety: - Individual verbal instruction and demonstration of equipment use and safety with use of the equipment. Flowsheet Row Cardiac Rehab from 10/20/2022 in [facility] Cardiac [facility] Pulmonary Rehab  Date 10/20/22  Educator [facility]  Instruction Review Code 1- Verbalizes Understanding       Education: Exercise Physiology &  General Exercise Guidelines: - Group verbal and written instruction with models to review the exercise physiology of the cardiovascular system and associated critical values. Provides general exercise guidelines with specific guidelines to those with heart or lung disease.    Education: Flexibility, Balance, Mind/Body Relaxation: - Group verbal and visual presentation with interactive activity on the components of exercise prescription. Introduces F.I.T.T principle from ACSM for exercise prescriptions. Reviews F.I.T.T. principles of flexibility and balance exercise training including progression. Also discusses the mind body connection.  Reviews various relaxation techniques to help reduce and manage stress (i.e. Deep breathing, progressive muscle relaxation, and visualization). Balance handout provided to take home. Written material given at graduation.   Activity Barriers & Risk Stratification:  Activity Barriers & Cardiac Risk Stratification - 10/20/22 1509       Activity Barriers & Cardiac Risk Stratification   Activity Barriers None    Cardiac Risk Stratification Moderate             6 Minute Walk:  6 Minute Walk     Row Name 10/20/22 1508         6 Minute Walk   Phase Initial     Distance 1320 feet     Walk Time 6 minutes     # of Rest Breaks 0     MPH 2.5     METS 3.85     RPE 11     Perceived Dyspnea  0     VO2 Peak 13.49     Symptoms No     Resting HR 78 bpm     Resting BP 136/86     Resting Oxygen Saturation  98 %     Exercise Oxygen Saturation  during 6 min walk 100 %     Max Ex. HR 104 bpm     Max Ex. BP 138/70     2 Minute Post BP 120/80              Oxygen Initial Assessment:   Oxygen Re-Evaluation:   Oxygen Discharge (Final Oxygen Re-Evaluation):   Initial Exercise Prescription:  Initial Exercise Prescription - 10/20/22 1500       Date of Initial Exercise RX and Referring Provider   Date 10/20/22    Referring Provider Paraschos       Oxygen   Maintain Oxygen Saturation 88% or higher      Treadmill   MPH 2.7    Grade 1    Minutes 15    METs 3.44      NuStep   Level 3    SPM 80    Minutes 15    METs 3.85      REL-XR   Level 2    Speed 50    Minutes 15    METs 3.85      Biostep-RELP   Level 2    SPM 50    Minutes 15    METs 3.85      Prescription Details   Frequency (times per week) 3    Duration Progress to 30 minutes of  continuous aerobic without signs/symptoms of physical distress      Intensity   THRR 40-80% of Max Heartrate 110-143    Ratings of Perceived Exertion 11-13    Perceived Dyspnea 0-4      Progression   Progression Continue to progress workloads to maintain intensity without signs/symptoms of physical distress.      Resistance Training   Training Prescription Yes    Weight 3    Reps 10-15             Perform Capillary Blood Glucose checks as needed.  Exercise Prescription Changes:   Exercise Prescription Changes     Row Name 10/20/22 1500             Response to Exercise   Blood Pressure (Admit) 136/86       Blood Pressure (Exercise) 138/70       Blood Pressure (Exit) 120/80       Heart Rate (Admit) 78 bpm       Heart Rate (Exercise) 104 bpm       Heart Rate (Exit) 83 bpm       Oxygen Saturation (Admit) 98 %       Oxygen Saturation (Exercise) 100 %       Oxygen Saturation (Exit) 100 %       Rating of Perceived Exertion (Exercise) 11       Perceived Dyspnea (Exercise) 0       Symptoms none       Comments 6 MWT results                Exercise Comments:   Exercise Goals and Review:   Exercise Goals     Row Name 10/20/22 1516             Exercise Goals   Increase Physical Activity Yes       Intervention Provide advice, education, support and counseling about physical activity/exercise needs.;Develop an individualized exercise prescription for aerobic and resistive training based on initial evaluation findings, risk stratification,  comorbidities and participant's personal goals.       Expected Outcomes Short Term: Attend rehab on a regular basis to increase amount of physical activity.;Long Term: Add in home exercise to make exercise part of routine and to increase amount of physical activity.;Long Term: Exercising regularly at least 3-5 days a week.       Increase Strength and Stamina Yes       Intervention Provide advice, education, support and counseling about physical activity/exercise needs.;Develop an individualized exercise prescription for aerobic and resistive training based on initial evaluation findings, risk stratification, comorbidities and participant's personal goals.       Expected Outcomes Short Term: Increase workloads from initial exercise prescription for resistance, speed, and METs.;Short Term: Perform resistance training exercises routinely during rehab and add in resistance training at home;Long Term: Improve cardiorespiratory fitness, muscular endurance and strength as measured by increased METs and functional capacity (6MWT)       Able to understand and use rate of perceived exertion (RPE) scale Yes       Intervention Provide education and explanation on how to use RPE scale       Expected Outcomes Short Term: Able to use RPE daily in rehab to express subjective intensity level;Long Term:  Able to use RPE to guide intensity level when exercising independently       Able to understand and use Dyspnea scale Yes       Intervention Provide education and  explanation on how to use Dyspnea scale       Expected Outcomes Short Term: Able to use Dyspnea scale daily in rehab to express subjective sense of shortness of breath during exertion;Long Term: Able to use Dyspnea scale to guide intensity level when exercising independently       Knowledge and understanding of Target Heart Rate Range (THRR) Yes       Intervention Provide education and explanation of THRR including how the numbers were predicted and where they  are located for reference       Expected Outcomes Short Term: Able to state/look up THRR;Long Term: Able to use THRR to govern intensity when exercising independently;Short Term: Able to use daily as guideline for intensity in rehab       Able to check pulse independently Yes       Intervention Provide education and demonstration on how to check pulse in carotid and radial arteries.;Review the importance of being able to check your own pulse for safety during independent exercise       Expected Outcomes Short Term: Able to explain why pulse checking is important during independent exercise       Understanding of Exercise Prescription Yes       Intervention Provide education, explanation, and written materials on patient's individual exercise prescription       Expected Outcomes Short Term: Able to explain program exercise prescription;Long Term: Able to explain home exercise prescription to exercise independently                Exercise Goals Re-Evaluation :   Discharge Exercise Prescription (Final Exercise Prescription Changes):  Exercise Prescription Changes - 10/20/22 1500       Response to Exercise   Blood Pressure (Admit) 136/86    Blood Pressure (Exercise) 138/70    Blood Pressure (Exit) 120/80    Heart Rate (Admit) 78 bpm    Heart Rate (Exercise) 104 bpm    Heart Rate (Exit) 83 bpm    Oxygen Saturation (Admit) 98 %    Oxygen Saturation (Exercise) 100 %    Oxygen Saturation (Exit) 100 %    Rating of Perceived Exertion (Exercise) 11    Perceived Dyspnea (Exercise) 0    Symptoms none    Comments 6 MWT results             Nutrition:  Target Goals: Understanding of nutrition guidelines, daily intake of sodium '1500mg'$ , cholesterol '200mg'$ , calories 30% from fat and 7% or less from saturated fats, daily to have 5 or more servings of fruits and vegetables.  Education: All About Nutrition: -Group instruction provided by verbal, written material, interactive activities,  discussions, models, and posters to present general guidelines for heart healthy nutrition including fat, fiber, MyPlate, the role of sodium in heart healthy nutrition, utilization of the nutrition label, and utilization of this knowledge for meal planning. Follow up email sent as well. Written material given at graduation.   Biometrics:  Pre Biometrics - 10/20/22 1517       Pre Biometrics   Height '5\' 10"'$  (1.778 m)    Weight 166 lb 1.6 oz (75.3 kg)    Waist Circumference 32 inches    Hip Circumference 38 inches    Waist to Hip Ratio 0.84 %    BMI (Calculated) 23.83    Single Leg Stand 30 seconds              Nutrition Therapy Plan and Nutrition Goals:  Nutrition Therapy & Goals -  10/20/22 1432       Nutrition Therapy   Diet Heart healthy, low Na    Drug/Food Interactions Coumadin/Vit K    Protein (specify units) 60-70g    Fiber 30 grams    Whole Grain Foods 3 servings    Saturated Fats 16 max. grams    Fruits and Vegetables 8 servings/day    Sodium 2 grams      Personal Nutrition Goals   Nutrition Goal ST: re-intoduce vitamin K rich food in consistent amounts with his healthcare team, improve food variety such as eating the rainbow and varying protein, include at least 1 source of healthy fat at most meals like nuts/seeds in salad LT: Follow MyPlate guide for most meals, continue with heart healthy eating pattern, increase variety    Comments 61 y.o. M admitted to cardiac rehab s/p AVR. PMHx includes acute CHF, a.fib, aortic insufficiency, OSA, dyslipidemia, prostate cancer, metabolic syndrome, s/p AVR 2007. Relevant medications includes lipitor, warfarin, trazodone, midodrine, tadalafil. Antonio [name] reports that he has tried dieting before, but kept "yoyo-ing", however he has found an eating pattern he could stick with which is lower in fat and carbohydrates, but he reports including carbohydrates packaged with fiber like smaller amounts of whole grains, fruit, and limited starchy  vegetables. Discussed making sure to have a good source of healthy fat at most meals to make sure he can absorb fat soluble nutrients and to include energy and fiber-rich carbohydrates as well.  B: 1 whole egg and 2 egg whites with spinach, blueberries, and a piece of bacon. Coffee with some splenda. L: chicken (air fry or bake) with a salad D: chicken and non-starchy vegetables. he uses some spray to keep it from sticking and some olive oil as well as spices. He has been limiting salt. Drinks: water or unsweet tea with 1 diet coke per day. Reviewed heart healthy eating and warfarin MNT - Antonio [name] was nervous about his vitamin K intake - discussed new guidelines on vitamin K consistency and provided handouts. Encouraged variety of foods.      Intervention Plan   Intervention Prescribe, educate and counsel regarding individualized specific dietary modifications aiming towards targeted core components such as weight, hypertension, lipid management, diabetes, heart failure and other comorbidities.;Nutrition handout(s) given to patient.    Expected Outcomes Short Term Goal: Understand basic principles of dietary content, such as calories, fat, sodium, cholesterol and nutrients.;Short Term Goal: A plan has been developed with personal nutrition goals set during dietitian appointment.;Long Term Goal: Adherence to prescribed nutrition plan.             Nutrition Assessments:  MEDIFICTS Score Key: ?70 Need to make dietary changes  40-70 Heart Healthy Diet ? 40 Therapeutic Level Cholesterol Diet  Flowsheet Row Cardiac Rehab from 10/20/2022 in [facility] Cardiac and Pulmonary Rehab  Picture Your Plate Total Score on Admission 75      Picture Your Plate Scores: <33 Unhealthy dietary pattern with much room for improvement. 41-50 Dietary pattern unlikely to meet recommendations for good health and room for improvement. 51-60 More healthful dietary pattern, with some room for improvement.  >60 Healthy dietary  pattern, although there may be some specific behaviors that could be improved.    Nutrition Goals Re-Evaluation:   Nutrition Goals Discharge (Final Nutrition Goals Re-Evaluation):   Psychosocial: Target Goals: Acknowledge presence or absence of significant depression and/or stress, maximize coping skills, provide positive support system. Participant is able to verbalize types and ability to use techniques and skills needed  for reducing stress and depression.   Education: Stress, Anxiety, and Depression - Group verbal and visual presentation to define topics covered.  Reviews how body is impacted by stress, anxiety, and depression.  Also discusses healthy ways to reduce stress and to treat/manage anxiety and depression.  Written material given at graduation.   Education: Sleep Hygiene -Provides group verbal and written instruction about how sleep can affect your health.  Define sleep hygiene, discuss sleep cycles and impact of sleep habits. Review good sleep hygiene tips.    Initial Review & Psychosocial Screening:  Initial Psych Review & Screening - 10/20/22 1010       Initial Review   Current issues with None Identified      Family Dynamics   Good Support System? Yes   wife, children, family     Barriers   Psychosocial barriers to participate in program There are no identifiable barriers or psychosocial needs.;The patient should benefit from training in stress management and relaxation.      Screening Interventions   Interventions Encouraged to exercise;Provide feedback about the scores to participant;To provide support and resources with identified psychosocial needs    Expected Outcomes Short Term goal: Utilizing psychosocial counselor, staff and physician to assist with identification of specific Stressors or current issues interfering with healing process. Setting desired goal for each stressor or current issue identified.;Long Term Goal: Stressors or current issues are  controlled or eliminated.;Short Term goal: Identification and review with participant of any Quality of Life or Depression concerns found by scoring the questionnaire.;Long Term goal: The participant improves quality of Life and PHQ9 Scores as seen by post scores and/or verbalization of changes             Quality of Life Scores:   Quality of Life - 10/20/22 1526       Quality of Life   Select Quality of Life      Quality of Life Scores   Health/Function Pre 26.67 %    Socioeconomic Pre 27.86 %    Psych/Spiritual Pre 29.29 %    Family Pre 27.5 %    GLOBAL Pre 27.57 %            Scores of 19 and below usually indicate a poorer quality of life in these areas.  A difference of  2-3 points is a clinically meaningful difference.  A difference of 2-3 points in the total score of the Quality of Life Index has been associated with significant improvement in overall quality of life, self-image, physical symptoms, and general health in studies assessing change in quality of life.  PHQ-9: Review Flowsheet       10/20/2022  Depression screen PHQ 2/9  Decreased Interest 0  Down, Depressed, Hopeless 0  PHQ - 2 Score 0  Altered sleeping 0  Tired, decreased energy 0  Change in appetite 0  Feeling bad or failure about yourself  0  Trouble concentrating 0  Moving slowly or fidgety/restless 0  Suicidal thoughts 0  PHQ-9 Score 0  Difficult doing work/chores Not difficult at all   Interpretation of Total Score  Total Score Depression Severity:  1-4 = Minimal depression, 5-9 = Mild depression, 10-14 = Moderate depression, 15-19 = Moderately severe depression, 20-27 = Severe depression   Psychosocial Evaluation and Intervention:  Psychosocial Evaluation - 10/20/22 1020       Psychosocial Evaluation & Interventions   Interventions Encouraged to exercise with the program and follow exercise prescription    Comments Antonio [name]  is coming to cardiac rehab post AVR. He had one done in  2007 so he is familiar with the program and process. He reports no stress concerns or sleep issues. He has a very supporting family and friend circle. He states recovery has been smooth so far. He is quite active and wants to get back to exercising like he previously was.    Expected Outcomes Short: attend cardiac rehab for education and exercise. Long: develop and maintian positive self care habits.    Continue Psychosocial Services  Follow up required by staff             Psychosocial Re-Evaluation:   Psychosocial Discharge (Final Psychosocial Re-Evaluation):   Vocational Rehabilitation: Provide vocational rehab assistance to qualifying candidates.   Vocational Rehab Evaluation & Intervention:  Vocational Rehab - 10/20/22 1010       Initial Vocational Rehab Evaluation & Intervention   Assessment shows need for Vocational Rehabilitation No             Education: Education Goals: Education classes will be provided on a variety of topics geared toward better understanding of heart health and risk factor modification. Participant will state understanding/return demonstration of topics presented as noted by education test scores.  Learning Barriers/Preferences:  Learning Barriers/Preferences - 10/20/22 1020       Learning Barriers/Preferences   Learning Barriers None    Learning Preferences None             General Cardiac Education Topics:  AED/CPR: - Group verbal and written instruction with the use of models to demonstrate the basic use of the AED with the basic ABC's of resuscitation.   Anatomy and Cardiac Procedures: - Group verbal and visual presentation and models provide information about basic cardiac anatomy and function. Reviews the testing methods done to diagnose heart disease and the outcomes of the test results. Describes the treatment choices: Medical Management, Angioplasty, or Coronary Bypass Surgery for treating various heart conditions  including Myocardial Infarction, Angina, Valve Disease, and Cardiac Arrhythmias.  Written material given at graduation. Flowsheet Row Cardiac Rehab from 10/20/2022 in [facility] Cardiac and Pulmonary Rehab  Education need identified 10/20/22       Medication Safety: - Group verbal and visual instruction to review commonly prescribed medications for heart and lung disease. Reviews the medication, class of the drug, and side effects. Includes the steps to properly store meds and maintain the prescription regimen.  Written material given at graduation.   Intimacy: - Group verbal instruction through game format to discuss how heart and lung disease can affect sexual intimacy. Written material given at graduation..   Know Your Numbers and Heart Failure: - Group verbal and visual instruction to discuss disease risk factors for cardiac and pulmonary disease and treatment options.  Reviews associated critical values for Overweight/Obesity, Hypertension, Cholesterol, and Diabetes.  Discusses basics of heart failure: signs/symptoms and treatments.  Introduces Heart Failure Zone chart for action plan for heart failure.  Written material given at graduation.   Infection Prevention: - Provides verbal and written material to individual with discussion of infection control including proper hand washing and proper equipment cleaning during exercise session. Flowsheet Row Cardiac Rehab from 10/20/2022 in [facility] Cardiac and Pulmonary Rehab  Date 10/20/22  Educator [facility]  Instruction Review Code 1- Verbalizes Understanding       Falls Prevention: - Provides verbal and written material to individual with discussion of falls prevention and safety. Flowsheet Row Cardiac Rehab from 10/20/2022 in [facility] Cardiac and Pulmonary  Rehab  Date 10/20/22  Educator [place]  Instruction Review Code 1- Verbalizes Understanding       Other: -Provides group and verbal instruction on various topics (see comments)   Knowledge  Questionnaire Score:  Knowledge Questionnaire Score - 10/20/22 1526       Knowledge Questionnaire Score   Pre Score 25/26             Core Components/Risk Factors/Patient Goals at Admission:  Personal Goals and Risk Factors at Admission - 10/20/22 1527       Core Components/Risk Factors/Patient Goals on Admission    Weight Management Yes    Intervention Weight Management: Develop a combined nutrition and exercise program designed to reach desired caloric intake, while maintaining appropriate intake of nutrient and fiber, sodium and fats, and appropriate energy expenditure required for the weight goal.;Weight Management: Provide education and appropriate resources to help participant work on and attain dietary goals.    Admit Weight 166 lb 1.6 oz (75.3 kg)    Goal Weight: Short Term 166 lb (75.3 kg)    Goal Weight: Long Term 166 lb (75.3 kg)    Expected Outcomes Short Term: Continue to assess and modify interventions until short term weight is achieved;Long Term: Adherence to nutrition and physical activity/exercise program aimed toward attainment of established weight goal;Weight Maintenance: Understanding of the daily nutrition guidelines, which includes 25-35% calories from fat, 7% or less cal from saturated fats, less than '200mg'$  cholesterol, less than 1.5gm of sodium, & 5 or more servings of fruits and vegetables daily;Understanding recommendations for meals to include 15-35% energy as protein, 25-35% energy from fat, 35-60% energy from carbohydrates, less than '200mg'$  of dietary cholesterol, 20-35 gm of total fiber daily;Understanding of distribution of calorie intake throughout the day with the consumption of 4-5 meals/snacks    Hypertension Yes    Intervention Provide education on lifestyle modifcations including regular physical activity/exercise, weight management, moderate sodium restriction and increased consumption of fresh fruit, vegetables, and low fat dairy, alcohol moderation,  and smoking cessation.;Monitor prescription use compliance.    Expected Outcomes Short Term: Continued assessment and intervention until BP is < 140/14m HG in hypertensive participants. < 130/870mHG in hypertensive participants with diabetes, heart failure or chronic kidney disease.;Long Term: Maintenance of blood pressure at goal levels.    Lipids Yes    Intervention Provide education and support for participant on nutrition & aerobic/resistive exercise along with prescribed medications to achieve LDL '70mg'$ , HDL >'40mg'$ .    Expected Outcomes Short Term: Participant states understanding of desired cholesterol values and is compliant with medications prescribed. Participant is following exercise prescription and nutrition guidelines.;Long Term: Cholesterol controlled with medications as prescribed, with individualized exercise RX and with personalized nutrition plan. Value goals: LDL < '70mg'$ , HDL > 40 mg.             Education:Diabetes - Individual verbal and written instruction to review signs/symptoms of diabetes, desired ranges of glucose level fasting, after meals and with exercise. Acknowledge that pre and post exercise glucose checks will be done for 3 sessions at entry of program.   Core Components/Risk Factors/Patient Goals Review:    Core Components/Risk Factors/Patient Goals at Discharge (Final Review):    ITP Comments:  ITP Comments     Row Name 10/20/22 1019 10/20/22 1506 10/22/22 0823       ITP Comments Initial telephone orientation completed. [facility] EP orientation scheduled for Monday 11/27 at 1:30 Completed 6MWT and gym orientation. Initial  ITP created and sent for review to Dr. Emily Filbert, Medical Director. 30 Day review completed. Medical Director ITP review done, changes made as directed, and signed approval by Medical Director. New to program              Comments:

## 2022-10-23 ENCOUNTER — Encounter: Payer: Managed Care, Other (non HMO) | Admitting: *Deleted

## 2022-10-23 DIAGNOSIS — Z952 Presence of prosthetic heart valve: Secondary | ICD-10-CM | POA: Diagnosis not present

## 2022-10-23 NOTE — Progress Notes (Signed)
Daily Session Note  Patient Details  Name: Antonio [name] MRN: [id number] Date of Birth: January 18, 1961 Referring Provider:   Flowsheet Row Cardiac Rehab from 10/20/2022 in [facility] Cardiac and Pulmonary Rehab  Referring Provider Paraschos       Encounter Date: 10/23/2022  Check In:  Session Check In - 10/23/22 1705       Check-In   Supervising physician immediately available to respond to emergencies See telemetry face sheet for immediately available ER MD    Location [facility]-Cardiac & Pulmonary Rehab    Staff Present Renita Papa, RN BSN;Joseph Tessie Fass, RCP,RRT,BSRT;Noah [place], [place], Exercise Physiologist    Virtual Visit No    Medication changes reported     No    Fall or balance concerns reported    No    Warm-up and Cool-down Performed on first and last piece of equipment    Resistance Training Performed Yes    VAD Patient? No    PAD/SET Patient? No      Pain Assessment   Currently in Pain? No/denies                Social History   Tobacco Use  Smoking Status Never  Smokeless Tobacco Never    Goals Met:  Independence with exercise equipment Exercise tolerated well No report of concerns or symptoms today Strength training completed today  Goals Unmet:  Not Applicable  Comments: Pt able to follow exercise prescription today without complaint.  Will continue to monitor for progression.   Dr. Emily Filbert is Medical Director for [place].  Dr. Ottie Glazier is Medical Director for [facility] Pulmonary Rehabilitation.

## 2022-10-27 ENCOUNTER — Ambulatory Visit: Payer: Managed Care, Other (non HMO)

## 2022-11-03 ENCOUNTER — Encounter: Payer: Managed Care, Other (non HMO) | Attending: Cardiology | Admitting: *Deleted

## 2022-11-03 DIAGNOSIS — Z952 Presence of prosthetic heart valve: Secondary | ICD-10-CM | POA: Insufficient documentation

## 2022-11-03 NOTE — Progress Notes (Signed)
Daily Session Note  Patient Details  Name: Antonio [name] MRN: [id number] Date of Birth: 29-Dec-1960 Referring Provider:   Flowsheet Row Cardiac Rehab from 10/20/2022 in [facility] Cardiac and Pulmonary Rehab  Referring Provider Paraschos       Encounter Date: 11/03/2022  Check In:  Session Check In - 11/03/22 1713       Check-In   Supervising physician immediately available to respond to emergencies See telemetry face sheet for immediately available ER MD    Location [facility]-Cardiac & Pulmonary Rehab    Staff Present Renita Papa, RN Moises Blood, BS, ACSM CEP, Exercise Physiologist;Joseph Tessie Fass, [place]    Virtual Visit No    Medication changes reported     No    Fall or balance concerns reported    No    Warm-up and Cool-down Performed on first and last piece of equipment    Resistance Training Performed Yes    VAD Patient? No    PAD/SET Patient? No      Pain Assessment   Currently in Pain? No/denies                Social History   Tobacco Use  Smoking Status Never  Smokeless Tobacco Never    Goals Met:  Independence with exercise equipment Exercise tolerated well No report of concerns or symptoms today Strength training completed today  Goals Unmet:  Not Applicable  Comments: Pt able to follow exercise prescription today without complaint.  Will continue to monitor for progression.    Dr. Emily Filbert is Medical Director for [place].  Dr. Ottie Glazier is Medical Director for [facility] Pulmonary Rehabilitation.

## 2022-11-05 ENCOUNTER — Encounter: Payer: Managed Care, Other (non HMO) | Admitting: *Deleted

## 2022-11-05 DIAGNOSIS — Z952 Presence of prosthetic heart valve: Secondary | ICD-10-CM | POA: Diagnosis not present

## 2022-11-05 NOTE — Progress Notes (Signed)
Daily Session Note  Patient Details  Name: Antonio [name] MRN: [id number] Date of Birth: Oct 31, 1961 Referring Provider:   Flowsheet Row Cardiac Rehab from 10/20/2022 in [facility] Cardiac and Pulmonary Rehab  Referring Provider Paraschos       Encounter Date: 11/05/2022  Check In:  Session Check In - 11/05/22 1714       Check-In   Supervising physician immediately available to respond to emergencies See telemetry face sheet for immediately available ER MD    Location [facility]-Cardiac & Pulmonary Rehab    Staff Present Renita Papa, RN BSN;Joseph Tessie Fass, RCP,RRT,BSRT;Kristen [place], [place]    Virtual Visit No    Medication changes reported     No    Fall or balance concerns reported    No    Warm-up and Cool-down Performed on first and last piece of equipment    Resistance Training Performed Yes    VAD Patient? No      Pain Assessment   Currently in Pain? No/denies                Social History   Tobacco Use  Smoking Status Never  Smokeless Tobacco Never    Goals Met:  Independence with exercise equipment Exercise tolerated well No report of concerns or symptoms today Strength training completed today  Goals Unmet:  Not Applicable  Comments: Pt able to follow exercise prescription today without complaint.  Will continue to monitor for progression.    Dr. Emily Filbert is Medical Director for [place].  Dr. Ottie Glazier is Medical Director for [facility] Pulmonary Rehabilitation.

## 2022-11-06 ENCOUNTER — Encounter: Payer: Managed Care, Other (non HMO) | Admitting: *Deleted

## 2022-11-06 DIAGNOSIS — Z952 Presence of prosthetic heart valve: Secondary | ICD-10-CM

## 2022-11-06 NOTE — Progress Notes (Signed)
Daily Session Note  Patient Details  Name: Antonio [name] MRN: [id number] Date of Birth: 06-Nov-1961 Referring Provider:   Flowsheet Row Cardiac Rehab from 10/20/2022 in [facility] Cardiac and Pulmonary Rehab  Referring Provider Paraschos       Encounter Date: 11/06/2022  Check In:  Session Check In - 11/06/22 1716       Check-In   Supervising physician immediately available to respond to emergencies See telemetry face sheet for immediately available ER MD    Location [facility]-Cardiac & Pulmonary Rehab    Staff Present Renita Papa, RN BSN;Joseph Tessie Fass, RCP,RRT,BSRT;Noah [place], [place], Exercise Physiologist    Virtual Visit No    Medication changes reported     No    Fall or balance concerns reported    No    Warm-up and Cool-down Performed on first and last piece of equipment    Resistance Training Performed Yes    VAD Patient? No    PAD/SET Patient? No      Pain Assessment   Currently in Pain? No/denies                Social History   Tobacco Use  Smoking Status Never  Smokeless Tobacco Never    Goals Met:  Independence with exercise equipment Exercise tolerated well No report of concerns or symptoms today Strength training completed today  Goals Unmet:  Not Applicable  Comments: Pt able to follow exercise prescription today without complaint.  Will continue to monitor for progression.    Dr. Emily Filbert is Medical Director for [place].  Dr. Ottie Glazier is Medical Director for [facility] Pulmonary Rehabilitation.

## 2022-11-10 ENCOUNTER — Encounter: Payer: Managed Care, Other (non HMO) | Admitting: *Deleted

## 2022-11-10 DIAGNOSIS — Z952 Presence of prosthetic heart valve: Secondary | ICD-10-CM | POA: Diagnosis not present

## 2022-11-10 NOTE — Progress Notes (Signed)
Daily Session Note  Patient Details  Name: Antonio [name] MRN: [id number] Date of Birth: Oct 06, 1961 Referring Provider:   Flowsheet Row Cardiac Rehab from 10/20/2022 in [facility] Cardiac and Pulmonary Rehab  Referring Provider Paraschos       Encounter Date: 11/10/2022  Check In:  Session Check In - 11/10/22 1713       Check-In   Supervising physician immediately available to respond to emergencies See telemetry face sheet for immediately available ER MD    Location [facility]-Cardiac & Pulmonary Rehab    Staff Present Renita Papa, RN BSN;Joseph Tessie Fass, RCP,RRT,BSRT;Noah [employer], [place], Exercise Physiologist    Virtual Visit No    Medication changes reported     No    Fall or balance concerns reported    No    Warm-up and Cool-down Performed on first and last piece of equipment    Resistance Training Performed Yes    VAD Patient? No    PAD/SET Patient? No      Pain Assessment   Currently in Pain? No/denies                Social History   Tobacco Use  Smoking Status Never  Smokeless Tobacco Never    Goals Met:  Independence with exercise equipment Exercise tolerated well No report of concerns or symptoms today Strength training completed today  Goals Unmet:  Not Applicable  Comments: Pt able to follow exercise prescription today without complaint.  Will continue to monitor for progression.    Dr. Emily Filbert is Medical Director for [place].  Dr. Ottie Glazier is Medical Director for [facility] Pulmonary Rehabilitation.

## 2022-11-12 ENCOUNTER — Encounter: Payer: Managed Care, Other (non HMO) | Admitting: *Deleted

## 2022-11-12 DIAGNOSIS — Z952 Presence of prosthetic heart valve: Secondary | ICD-10-CM | POA: Diagnosis not present

## 2022-11-12 NOTE — Progress Notes (Signed)
Daily Session Note  Patient Details  Name: Antonio [name] MRN: [id number] Date of Birth: [birth year] Referring Provider:   Flowsheet Row Cardiac Rehab from 10/20/2022 in [facility] Cardiac and Pulmonary Rehab  Referring Provider Paraschos       Encounter Date: 11/12/2022  Check In:  Session Check In - 11/12/22 1656       Check-In   Supervising physician immediately available to respond to emergencies See telemetry face sheet for immediately available ER MD    Location [facility]-Cardiac & Pulmonary Rehab    Staff Present Renita Papa, RN BSN;Joseph Chauncey, RCP,RRT,BSRT;Kelly [place], [place], ACSM CEP, Exercise Physiologist    Virtual Visit No    Medication changes reported     No    Fall or balance concerns reported    No    Warm-up and Cool-down Performed on first and last piece of equipment    Resistance Training Performed Yes    VAD Patient? No    PAD/SET Patient? No      Pain Assessment   Currently in Pain? No/denies                Social History   Tobacco Use  Smoking Status Never  Smokeless Tobacco Never    Goals Met:  Independence with exercise equipment Exercise tolerated well No report of concerns or symptoms today Strength training completed today  Goals Unmet:  Not Applicable  Comments: Pt able to follow exercise prescription today without complaint.  Will continue to monitor for progression.    Dr. Emily Filbert is Medical Director for [place].  Dr. Ottie Glazier is Medical Director for [facility] Pulmonary Rehabilitation.

## 2022-11-13 ENCOUNTER — Encounter: Payer: Managed Care, Other (non HMO) | Admitting: *Deleted

## 2022-11-13 DIAGNOSIS — Z952 Presence of prosthetic heart valve: Secondary | ICD-10-CM

## 2022-11-13 NOTE — Progress Notes (Signed)
Daily Session Note  Patient Details  Name: Antonio [name] MRN: [id number] Date of Birth: [birth year] Referring Provider:   Flowsheet Row Cardiac Rehab from 10/20/2022 in [facility] Cardiac and Pulmonary Rehab  Referring Provider Paraschos       Encounter Date: 11/13/2022  Check In:  Session Check In - 11/13/22 1116       Check-In   Supervising physician immediately available to respond to emergencies See telemetry face sheet for immediately available ER MD    Location [facility]-Cardiac & Pulmonary Rehab    Staff Present Renita Papa, RN BSN;Joseph Tessie Fass, RCP,RRT,BSRT;Noah [place], [place], Exercise Physiologist    Virtual Visit No    Medication changes reported     No    Fall or balance concerns reported    No    Warm-up and Cool-down Performed on first and last piece of equipment    Resistance Training Performed Yes    VAD Patient? No    PAD/SET Patient? No      Pain Assessment   Currently in Pain? No/denies                Social History   Tobacco Use  Smoking Status Never  Smokeless Tobacco Never    Goals Met:  Independence with exercise equipment Exercise tolerated well No report of concerns or symptoms today Strength training completed today  Goals Unmet:  Not Applicable  Comments: Pt able to follow exercise prescription today without complaint.  Will continue to monitor for progression.    Dr. Emily Filbert is Medical Director for [place].  Dr. Ottie Glazier is Medical Director for [facility] Pulmonary Rehabilitation.

## 2022-11-19 ENCOUNTER — Encounter: Payer: Managed Care, Other (non HMO) | Admitting: *Deleted

## 2022-11-19 ENCOUNTER — Encounter: Payer: Self-pay | Admitting: *Deleted

## 2022-11-19 DIAGNOSIS — Z952 Presence of prosthetic heart valve: Secondary | ICD-10-CM | POA: Diagnosis not present

## 2022-11-19 NOTE — Progress Notes (Signed)
Daily Session Note  Patient Details  Name: Antonio [name] MRN: [id number] Date of Birth: 1961-10-02 Referring Provider:   Flowsheet Row Cardiac Rehab from 10/20/2022 in [facility] Cardiac and Pulmonary Rehab  Referring Provider Paraschos       Encounter Date: 11/19/2022  Check In:  Session Check In - 11/19/22 1719       Check-In   Supervising physician immediately available to respond to emergencies See telemetry face sheet for immediately available ER MD    Location [facility]-Cardiac & Pulmonary Rehab    Staff Present Renita Papa, RN BSN;Kristen [place], RN,BC,MSN;Kara Maricela Bo, MS, ACSM CEP, Exercise Physiologist    Virtual Visit No    Medication changes reported     No    Fall or balance concerns reported    No    Warm-up and Cool-down Performed on first and last piece of equipment    Resistance Training Performed Yes    VAD Patient? No    PAD/SET Patient? No      Pain Assessment   Currently in Pain? No/denies                Social History   Tobacco Use  Smoking Status Never  Smokeless Tobacco Never    Goals Met:  Independence with exercise equipment Exercise tolerated well No report of concerns or symptoms today Strength training completed today  Goals Unmet:  Not Applicable  Comments: Pt able to follow exercise prescription today without complaint.  Will continue to monitor for progression.    Dr. Emily Filbert is Medical Director for [place].  Dr. Ottie Glazier is Medical Director for [facility] Pulmonary Rehabilitation.

## 2022-11-19 NOTE — Progress Notes (Signed)
Cardiac Individual Treatment Plan  Patient Details  Name: STEVEN BASSO MRN: [id number] Date of Birth: May 25, 1961 Referring Provider:   Flowsheet Row Cardiac Rehab from 10/20/2022 in [facility] Cardiac and Pulmonary Rehab  Referring Provider Paraschos       Initial Encounter Date:  Flowsheet Row Cardiac Rehab from 10/20/2022 in [facility] Cardiac and Pulmonary Rehab  Date 10/20/22       Visit Diagnosis: S/P AVR (aortic valve replacement)  Patient's Home Medications on Admission:  Current Outpatient Medications:    acetaminophen (TYLENOL) 500 MG tablet, Take 1,000 mg by mouth every 6 (six) hours as needed (for pain.)., Disp: , Rfl:    aspirin EC 81 MG tablet, Take 81 mg by mouth daily., Disp: , Rfl:    atorvastatin (LIPITOR) 10 MG tablet, Take 10 mg by mouth every evening. , Disp: , Rfl:    cetirizine (ZYRTEC) 10 MG tablet, Take 10 mg by mouth daily., Disp: , Rfl:    loratadine (CLARITIN) 10 MG tablet, Take by mouth., Disp: , Rfl:    midodrine (PROAMATINE) 10 MG tablet, Take 1 tablet (10 mg total) by mouth 3 (three) times daily with meals., Disp: , Rfl:    Polyethyl Glycol-Propyl Glycol (LUBRICANT EYE DROPS) 0.4-0.3 % SOLN, Place 1 drop into both eyes 3 (three) times daily as needed (dry/irritated eyes.)., Disp: , Rfl:    tadalafil (CIALIS) 5 MG tablet, Take 1-3 tablets (5-15 mg total) by mouth daily as needed for erectile dysfunction., Disp: 30 tablet, Rfl: 11   tamsulosin (FLOMAX) 0.4 MG CAPS capsule, TAKE 1 CAPSULE(0.4 MG) BY MOUTH DAILY AFTER SUPPER, Disp: 90 capsule, Rfl: 3   traZODone (DESYREL) 50 MG tablet, Take 0.5 tablets (25 mg total) by mouth at bedtime as needed for sleep., Disp: , Rfl:    warfarin (COUMADIN) 2 MG tablet, Take 2 mg by mouth., Disp: , Rfl:   Past Medical History: Past Medical History:  Diagnosis Date   Aortic aneurysm ([place])    Aortic insufficiency 07/12/2014   Overview:  Mild to moderate   H/O bicuspid aortic valve 11/04/2013   Heart palpitations 05/15/2017    Hypertension    Metabolic syndrome 01/28/6282   OSA (obstructive sleep apnea) 06/11/2018   no CPAP/None suggested   Premature ventricular contractions 05/28/2017   S/P aortic valve replacement with bioprosthetic valve 11/04/2013   Overview:  With carpentier-edwards bovine pericardial valve and aortic aneurysm repair 03/26/06 by Dr Ysidro Evert at [facility]    Tobacco Use: Social History   Tobacco Use  Smoking Status Never  Smokeless Tobacco Never    Labs: Review Flowsheet        No data to display           Exercise Target Goals: Exercise Program Goal: Individual exercise prescription set using results from initial 6 min walk test and THRR while considering  patient's activity barriers and safety.   Exercise Prescription Goal: Initial exercise prescription builds to 30-45 minutes a day of aerobic activity, 2-3 days per week.  Home exercise guidelines will be given to patient during program as part of exercise prescription that the participant will acknowledge.   Education: Aerobic Exercise: - Group verbal and visual presentation on the components of exercise prescription. Introduces F.I.T.T principle from ACSM for exercise prescriptions.  Reviews F.I.T.T. principles of aerobic exercise including progression. Written material given at graduation.   Education: Resistance Exercise: - Group verbal and visual presentation on the components of exercise prescription. Introduces F.I.T.T principle from ACSM for exercise prescriptions  Reviews  F.I.T.T. principles of resistance exercise including progression. Written material given at graduation.    Education: Exercise & Equipment Safety: - Individual verbal instruction and demonstration of equipment use and safety with use of the equipment. Flowsheet Row Cardiac Rehab from 11/12/2022 in [facility] Cardiac and Pulmonary Rehab  Date 10/20/22  Educator [facility]  Instruction Review Code 1- Verbalizes Understanding       Education: Exercise Physiology &  General Exercise Guidelines: - Group verbal and written instruction with models to review the exercise physiology of the cardiovascular system and associated critical values. Provides general exercise guidelines with specific guidelines to those with heart or lung disease.    Education: Flexibility, Balance, Mind/Body Relaxation: - Group verbal and visual presentation with interactive activity on the components of exercise prescription. Introduces F.I.T.T principle from ACSM for exercise prescriptions. Reviews F.I.T.T. principles of flexibility and balance exercise training including progression. Also discusses the mind body connection.  Reviews various relaxation techniques to help reduce and manage stress (i.e. Deep breathing, progressive muscle relaxation, and visualization). Balance handout provided to take home. Written material given at graduation.   Activity Barriers & Risk Stratification:  Activity Barriers & Cardiac Risk Stratification - 10/20/22 1509       Activity Barriers & Cardiac Risk Stratification   Activity Barriers None    Cardiac Risk Stratification Moderate             6 Minute Walk:  6 Minute Walk     Row Name 10/20/22 1508         6 Minute Walk   Phase Initial     Distance 1320 feet     Walk Time 6 minutes     # of Rest Breaks 0     MPH 2.5     METS 3.85     RPE 11     Perceived Dyspnea  0     VO2 Peak 13.49     Symptoms No     Resting HR 78 bpm     Resting BP 136/86     Resting Oxygen Saturation  98 %     Exercise Oxygen Saturation  during 6 min walk 100 %     Max Ex. HR 104 bpm     Max Ex. BP 138/70     2 Minute Post BP 120/80              Oxygen Initial Assessment:   Oxygen Re-Evaluation:   Oxygen Discharge (Final Oxygen Re-Evaluation):   Initial Exercise Prescription:  Initial Exercise Prescription - 10/20/22 1500       Date of Initial Exercise RX and Referring Provider   Date 10/20/22    Referring Provider Paraschos       Oxygen   Maintain Oxygen Saturation 88% or higher      Treadmill   MPH 2.7    Grade 1    Minutes 15    METs 3.44      NuStep   Level 3    SPM 80    Minutes 15    METs 3.85      REL-XR   Level 2    Speed 50    Minutes 15    METs 3.85      Biostep-RELP   Level 2    SPM 50    Minutes 15    METs 3.85      Prescription Details   Frequency (times per week) 3    Duration Progress to 30 minutes of  continuous aerobic without signs/symptoms of physical distress      Intensity   THRR 40-80% of Max Heartrate 110-143    Ratings of Perceived Exertion 11-13    Perceived Dyspnea 0-4      Progression   Progression Continue to progress workloads to maintain intensity without signs/symptoms of physical distress.      Resistance Training   Training Prescription Yes    Weight 3    Reps 10-15             Perform Capillary Blood Glucose checks as needed.  Exercise Prescription Changes:   Exercise Prescription Changes     Row Name 10/20/22 1500 11/03/22 1500 11/18/22 1500         Response to Exercise   Blood Pressure (Admit) 136/86 136/68 124/76     Blood Pressure (Exercise) 138/70 134/66 140/82     Blood Pressure (Exit) 120/80 126/68 108/62     Heart Rate (Admit) 78 bpm 81 bpm 64 bpm     Heart Rate (Exercise) 104 bpm 127 bpm 141 bpm     Heart Rate (Exit) 83 bpm 99 bpm 92 bpm     Oxygen Saturation (Admit) 98 % -- --     Oxygen Saturation (Exercise) 100 % -- --     Oxygen Saturation (Exit) 100 % -- --     Rating of Perceived Exertion (Exercise) _0 Perceived Dyspnea (Exercise) 0 -- --     Symptoms none none none     Comments 6 MWT results 2nd full day of exercise --     Duration -- Continue with 30 min of aerobic exercise without signs/symptoms of physical distress. Continue with 30 min of aerobic exercise without signs/symptoms of physical distress.     Intensity -- THRR unchanged THRR unchanged       Progression   Progression -- Continue to progress  workloads to maintain intensity without signs/symptoms of physical distress. Continue to progress workloads to maintain intensity without signs/symptoms of physical distress.     Average METs -- 3.73 4.38       Resistance Training   Training Prescription -- Yes Yes     Weight -- 3 lb 3 lb     Reps -- 10-15 10-15       Interval Training   Interval Training -- No No       Treadmill   MPH -- 3 3.5     Grade -- 1 2     Minutes -- 15 15     METs -- 3.71 4.65       Elliptical   Level -- -- 2     Speed -- -- 4.5     Minutes -- -- 15       REL-XR   Level -- 3 5     Minutes -- 15 15     METs -- 3.8 --       Biostep-RELP   Level -- 2 3     Minutes -- 15 15     METs -- 4 4       Oxygen   Maintain Oxygen Saturation -- 88% or higher 88% or higher              Exercise Comments:   Exercise Comments     Row Name 10/22/22 1646           Exercise Comments First full day of exercise!  Patient was oriented to gym and  equipment including functions, settings, policies, and procedures.  Patient's individual exercise prescription and treatment plan were reviewed.  All starting workloads were established based on the results of the 6 minute walk test done at initial orientation visit.  The plan for exercise progression was also introduced and progression will be customized based on patient's performance and goals.                Exercise Goals and Review:   Exercise Goals     Row Name 10/20/22 1516             Exercise Goals   Increase Physical Activity Yes       Intervention Provide advice, education, support and counseling about physical activity/exercise needs.;Develop an individualized exercise prescription for aerobic and resistive training based on initial evaluation findings, risk stratification, comorbidities and participant's personal goals.       Expected Outcomes Short Term: Attend rehab on a regular basis to increase amount of physical activity.;Long Term:  Add in home exercise to make exercise part of routine and to increase amount of physical activity.;Long Term: Exercising regularly at least 3-5 days a week.       Increase Strength and Stamina Yes       Intervention Provide advice, education, support and counseling about physical activity/exercise needs.;Develop an individualized exercise prescription for aerobic and resistive training based on initial evaluation findings, risk stratification, comorbidities and participant's personal goals.       Expected Outcomes Short Term: Increase workloads from initial exercise prescription for resistance, speed, and METs.;Short Term: Perform resistance training exercises routinely during rehab and add in resistance training at home;Long Term: Improve cardiorespiratory fitness, muscular endurance and strength as measured by increased METs and functional capacity (6MWT)       Able to understand and use rate of perceived exertion (RPE) scale Yes       Intervention Provide education and explanation on how to use RPE scale       Expected Outcomes Short Term: Able to use RPE daily in rehab to express subjective intensity level;Long Term:  Able to use RPE to guide intensity level when exercising independently       Able to understand and use Dyspnea scale Yes       Intervention Provide education and explanation on how to use Dyspnea scale       Expected Outcomes Short Term: Able to use Dyspnea scale daily in rehab to express subjective sense of shortness of breath during exertion;Long Term: Able to use Dyspnea scale to guide intensity level when exercising independently       Knowledge and understanding of Target Heart Rate Range (THRR) Yes       Intervention Provide education and explanation of THRR including how the numbers were predicted and where they are located for reference       Expected Outcomes Short Term: Able to state/look up THRR;Long Term: Able to use THRR to govern intensity when exercising  independently;Short Term: Able to use daily as guideline for intensity in rehab       Able to check pulse independently Yes       Intervention Provide education and demonstration on how to check pulse in carotid and radial arteries.;Review the importance of being able to check your own pulse for safety during independent exercise       Expected Outcomes Short Term: Able to explain why pulse checking is important during independent exercise       Understanding of Exercise Prescription  Yes       Intervention Provide education, explanation, and written materials on patient's individual exercise prescription       Expected Outcomes Short Term: Able to explain program exercise prescription;Long Term: Able to explain home exercise prescription to exercise independently                Exercise Goals Re-Evaluation :  Exercise Goals Re-Evaluation     Row Name 10/22/22 1646 11/03/22 1554 11/18/22 1510         Exercise Goal Re-Evaluation   Exercise Goals Review Increase Physical Activity;Able to understand and use rate of perceived exertion (RPE) scale;Knowledge and understanding of Target Heart Rate Range (THRR);Understanding of Exercise Prescription;Increase Strength and Stamina;Able to check pulse independently Increase Physical Activity;Increase Strength and Stamina;Understanding of Exercise Prescription Increase Physical Activity;Increase Strength and Stamina;Understanding of Exercise Prescription     Comments Reviewed RPE scale, THR and program prescription with pt today.  Pt voiced understanding and was given a copy of goals to take home. Jamar is off to a good start with rehab for the first couple of sessions he has been here. He increased to 3 mph/ 1% incline on the treadmill and RPEs were on the lighter end. He hit his THR both sessions thus far. We will continue to monitor as he progresses in the program. Woodfin continues to do well in rehab. He recently increased his overall average MET level to  4.38 METs. He also improved to level 5 on the XR and level 3 on the biostep. he increased his workload on the treadmill as well, to a speed of 3.5 mph and an incline of 2%. We will continue to monitor his progress in the program.     Expected Outcomes Short: Use RPE daily to regulate intensity.  Long: Follow program prescription in THR. Short: Follow exercise prescription, increase workload on seated machines slowly Long: Increase overall MET level Short: Continue to increase workloads. Long: Continue to improve strength and stamina.              Discharge Exercise Prescription (Final Exercise Prescription Changes):  Exercise Prescription Changes - 11/18/22 1500       Response to Exercise   Blood Pressure (Admit) 124/76    Blood Pressure (Exercise) 140/82    Blood Pressure (Exit) 108/62    Heart Rate (Admit) 64 bpm    Heart Rate (Exercise) 141 bpm    Heart Rate (Exit) 92 bpm    Rating of Perceived Exertion (Exercise) 13    Symptoms none    Duration Continue with 30 min of aerobic exercise without signs/symptoms of physical distress.    Intensity THRR unchanged      Progression   Progression Continue to progress workloads to maintain intensity without signs/symptoms of physical distress.    Average METs 4.38      Resistance Training   Training Prescription Yes    Weight 3 lb    Reps 10-15      Interval Training   Interval Training No      Treadmill   MPH 3.5    Grade 2    Minutes 15    METs 4.65      Elliptical   Level 2    Speed 4.5    Minutes 15      REL-XR   Level 5    Minutes 15      Biostep-RELP   Level 3    Minutes 15    METs 4  Oxygen   Maintain Oxygen Saturation 88% or higher             Nutrition:  Target Goals: Understanding of nutrition guidelines, daily intake of sodium <1535m, cholesterol <208m calories 30% from fat and 7% or less from saturated fats, daily to have 5 or more servings of fruits and vegetables.  Education: All  About Nutrition: -Group instruction provided by verbal, written material, interactive activities, discussions, models, and posters to present general guidelines for heart healthy nutrition including fat, fiber, MyPlate, the role of sodium in heart healthy nutrition, utilization of the nutrition label, and utilization of this knowledge for meal planning. Follow up email sent as well. Written material given at graduation. [facility]  Date 10/22/22  [facility] 1- Verbalizes Understanding       Biometrics:  Pre Biometrics - 10/20/22 1517       Pre Biometrics   Height _0  (1.778 m)    Weight 166 lb 1.6 oz (75.3 kg)    Waist Circumference 32 inches    Hip Circumference 38 inches    Waist to Hip Ratio 0.84 %    BMI (Calculated) 23.83    Single Leg Stand 30 seconds              Nutrition Therapy Plan and Nutrition Goals:  Nutrition Therapy & Goals - 10/20/22 1432       Nutrition Therapy   Diet Heart healthy, low Na    Drug/Food Interactions Coumadin/Vit K    Protein (specify units) 60-70g    Fiber 30 grams    Whole Grain Foods 3 servings    Saturated Fats 16 max. grams    Fruits and Vegetables 8 servings/day    Sodium 2 grams      Personal Nutrition Goals   Nutrition Goal ST: re-intoduce vitamin K rich food in consistent amounts with his healthcare team, improve food variety such as eating the rainbow and varying protein, include at least 1 source of healthy fat at most meals like nuts/seeds in salad LT: Follow MyPlate guide for most meals, continue with heart healthy eating pattern, increase variety    Comments [age].o. M admitted to cardiac rehab s/p AVR. PMHx includes acute CHF, a.fib, aortic insufficiency, OSA, dyslipidemia, prostate cancer, metabolic syndrome, s/p AVR 2007. Relevant medications includes lipitor, warfarin, trazodone, midodrine, tadalafil. LyKareleports that he has  tried dieting before, but kept "yoyo-ing", however he has found an eating pattern he could stick with which is lower in fat and carbohydrates, but he reports including carbohydrates packaged with fiber like smaller amounts of whole grains, fruit, and limited starchy vegetables. Discussed making sure to have a good source of healthy fat at most meals to make sure he can absorb fat soluble nutrients and to include energy and fiber-rich carbohydrates as well.  B: 1 whole egg and 2 egg whites with spinach, blueberries, and a piece of bacon. Coffee with some splenda. L: chicken (air fry or bake) with a salad D: chicken and non-starchy vegetables. he uses some spray to keep it from sticking and some olive oil as well as spices. He has been limiting salt. Drinks: water or unsweet tea with 1 diet coke per day. Reviewed heart healthy eating and warfarin MNT - LyJhoanas nervous about his vitamin K intake - discussed new guidelines on vitamin K consistency and provided handouts. Encouraged variety of foods.  Intervention Plan   Intervention Prescribe, educate and counsel regarding individualized specific dietary modifications aiming towards targeted core components such as weight, hypertension, lipid management, diabetes, heart failure and other comorbidities.;Nutrition handout(s) given to patient.    Expected Outcomes Short Term Goal: Understand basic principles of dietary content, such as calories, fat, sodium, cholesterol and nutrients.;Short Term Goal: A plan has been developed with personal nutrition goals set during dietitian appointment.;Long Term Goal: Adherence to prescribed nutrition plan.             Nutrition Assessments:  MEDIFICTS Score Key: ?70 Need to make dietary changes  40-70 Heart Healthy Diet ? 40 Therapeutic Level Cholesterol Diet  Flowsheet Row Cardiac Rehab from 10/20/2022 in [facility] Cardiac and Pulmonary Rehab  Picture Your Plate Total Score on Admission 75      Picture Your  Plate Scores: <46 Unhealthy dietary pattern with much room for improvement. 41-50 Dietary pattern unlikely to meet recommendations for good health and room for improvement. 51-60 More healthful dietary pattern, with some room for improvement.  >60 Healthy dietary pattern, although there may be some specific behaviors that could be improved.    Nutrition Goals Re-Evaluation:  Nutrition Goals Re-Evaluation     [place] Name 11/03/22 1736             Goals   Current Weight 168 lb (76.2 kg)       Nutrition Goal No questions at this time.       Comment Patient states that he is eating well and has no questions on his diet.                Nutrition Goals Discharge (Final Nutrition Goals Re-Evaluation):  Nutrition Goals Re-Evaluation - 11/03/22 1736       Goals   Current Weight 168 lb (76.2 kg)    Nutrition Goal No questions at this time.    Comment Patient states that he is eating well and has no questions on his diet.             Psychosocial: Target Goals: Acknowledge presence or absence of significant depression and/or stress, maximize coping skills, provide positive support system. Participant is able to verbalize types and ability to use techniques and skills needed for reducing stress and depression.   Education: Stress, Anxiety, and Depression - Group verbal and visual presentation to define topics covered.  Reviews how body is impacted by stress, anxiety, and depression.  Also discusses healthy ways to reduce stress and to treat/manage anxiety and depression.  Written material given at graduation.   Education: Sleep Hygiene -Provides group verbal and written instruction about how sleep can affect your health.  Define sleep hygiene, discuss sleep cycles and impact of sleep habits. Review good sleep hygiene tips.    Initial Review & Psychosocial Screening:  Initial Psych Review & Screening - 10/20/22 1010       Initial Review   Current issues with None Identified       Family Dynamics   Good Support System? Yes   wife, children, family     Barriers   Psychosocial barriers to participate in program There are no identifiable barriers or psychosocial needs.;The patient should benefit from training in stress management and relaxation.      Screening Interventions   Interventions Encouraged to exercise;Provide feedback about the scores to participant;To provide support and resources with identified psychosocial needs    Expected Outcomes Short Term goal: Utilizing psychosocial counselor, staff and physician to assist with identification  of specific Stressors or current issues interfering with healing process. Setting desired goal for each stressor or current issue identified.;Long Term Goal: Stressors or current issues are controlled or eliminated.;Short Term goal: Identification and review with participant of any Quality of Life or Depression concerns found by scoring the questionnaire.;Long Term goal: The participant improves quality of Life and PHQ9 Scores as seen by post scores and/or verbalization of changes             Quality of Life Scores:   Quality of Life - 10/20/22 1526       Quality of Life   Select Quality of Life      Quality of Life Scores   Health/Function Pre 26.67 %    Socioeconomic Pre 27.86 %    Psych/Spiritual Pre 29.29 %    Family Pre 27.5 %    GLOBAL Pre 27.57 %            Scores of 19 and below usually indicate a poorer quality of life in these areas.  A difference of  2-3 points is a clinically meaningful difference.  A difference of 2-3 points in the total score of the Quality of Life Index has been associated with significant improvement in overall quality of life, self-image, physical symptoms, and general health in studies assessing change in quality of life.  PHQ-9: Review Flowsheet       10/20/2022  Depression screen PHQ 2/9  Decreased Interest 0  Down, Depressed, Hopeless 0  PHQ - 2 Score 0  Altered  sleeping 0  Tired, decreased energy 0  Change in appetite 0  Feeling bad or failure about yourself  0  Trouble concentrating 0  Moving slowly or fidgety/restless 0  Suicidal thoughts 0  PHQ-9 Score 0  Difficult doing work/chores Not difficult at all   Interpretation of Total Score  Total Score Depression Severity:  1-4 = Minimal depression, 5-9 = Mild depression, 10-14 = Moderate depression, 15-19 = Moderately severe depression, 20-27 = Severe depression   Psychosocial Evaluation and Intervention:  Psychosocial Evaluation - 10/20/22 1020       Psychosocial Evaluation & Interventions   Interventions Encouraged to exercise with the program and follow exercise prescription    Comments Mr. Mcclenahan is coming to cardiac rehab post AVR. He had one done in 2007 so he is familiar with the program and process. He reports no stress concerns or sleep issues. He has a very supporting family and friend circle. He states recovery has been smooth so far. He is quite active and wants to get back to exercising like he previously was.    Expected Outcomes Short: attend cardiac rehab for education and exercise. Long: develop and maintian positive self care habits.    Continue Psychosocial Services  Follow up required by staff             Psychosocial Re-Evaluation:  Psychosocial Re-Evaluation     [place] Name 11/03/22 1737             Psychosocial Re-Evaluation   Current issues with None Identified       Comments Patient reports no issues with their current mental states, sleep, stress, depression or anxiety. Will follow up with patient in a few weeks for any changes.       Expected Outcomes Short: Continue to exercise regularly to support mental health and notify staff of any changes. Long: maintain mental health and well being through teaching of rehab or prescribed medications independently.  Interventions Encouraged to attend Cardiac Rehabilitation for the exercise       Continue  Psychosocial Services  Follow up required by staff                Psychosocial Discharge (Final Psychosocial Re-Evaluation):  Psychosocial Re-Evaluation - 11/03/22 1737       Psychosocial Re-Evaluation   Current issues with None Identified    Comments Patient reports no issues with their current mental states, sleep, stress, depression or anxiety. Will follow up with patient in a few weeks for any changes.    Expected Outcomes Short: Continue to exercise regularly to support mental health and notify staff of any changes. Long: maintain mental health and well being through teaching of rehab or prescribed medications independently.    Interventions Encouraged to attend Cardiac Rehabilitation for the exercise    Continue Psychosocial Services  Follow up required by staff             Vocational Rehabilitation: Provide vocational rehab assistance to qualifying candidates.   Vocational Rehab Evaluation & Intervention:  Vocational Rehab - 10/20/22 1010       Initial Vocational Rehab Evaluation & Intervention   Assessment shows need for Vocational Rehabilitation No             Education: Education Goals: Education classes will be provided on a variety of topics geared toward better understanding of heart health and risk factor modification. Participant will state understanding/return demonstration of topics presented as noted by education test scores.  Learning Barriers/Preferences:  Learning Barriers/Preferences - 10/20/22 1020       Learning Barriers/Preferences   Learning Barriers None    Learning Preferences None             General Cardiac Education Topics:  AED/CPR: - Group verbal and written instruction with the use of models to demonstrate the basic use of the AED with the basic ABC's of resuscitation.   Anatomy and Cardiac Procedures: - Group verbal and visual presentation and models provide information about basic cardiac anatomy and function.  Reviews the testing methods done to diagnose heart disease and the outcomes of the test results. Describes the treatment choices: Medical Management, Angioplasty, or Coronary Bypass Surgery for treating various heart conditions including Myocardial Infarction, Angina, Valve Disease, and Cardiac Arrhythmias.  Written material given at graduation. Flowsheet Row Cardiac Rehab from 11/12/2022 in [facility] Cardiac and Pulmonary Rehab  Education need identified 10/20/22       Medication Safety: - Group verbal and visual instruction to review commonly prescribed medications for heart and lung disease. Reviews the medication, class of the drug, and side effects. Includes the steps to properly store meds and maintain the prescription regimen.  Written material given at graduation.   Intimacy: - Group verbal instruction through game format to discuss how heart and lung disease can affect sexual intimacy. Written material given at graduation..   Know Your Numbers and Heart Failure: - Group verbal and visual instruction to discuss disease risk factors for cardiac and pulmonary disease and treatment options.  Reviews associated critical values for Overweight/Obesity, Hypertension, Cholesterol, and Diabetes.  Discusses basics of heart failure: signs/symptoms and treatments.  Introduces Heart Failure Zone chart for action plan for heart failure.  Written material given at graduation. Flowsheet Row Cardiac Rehab from 11/12/2022 in [facility] Cardiac and Pulmonary Rehab  Date 11/12/22  Educator SB  Instruction Review Code 1- Verbalizes Understanding       Infection Prevention: - Provides verbal and written material  to individual with discussion of infection control including proper hand washing and proper equipment cleaning during exercise session. Flowsheet Row Cardiac Rehab from 11/12/2022 in [facility] Cardiac and Pulmonary Rehab  Date 10/20/22  Educator [facility]  Instruction Review Code 1- Verbalizes Understanding        Falls Prevention: - Provides verbal and written material to individual with discussion of falls prevention and safety. Flowsheet Row Cardiac Rehab from 11/12/2022 in [facility] Cardiac and Pulmonary Rehab  Date 10/20/22  Educator [facility]  Instruction Review Code 1- Verbalizes Understanding       Other: -Provides group and verbal instruction on various topics (see comments)   Knowledge Questionnaire Score:  Knowledge Questionnaire Score - 10/20/22 1526       Knowledge Questionnaire Score   Pre Score 25/26             Core Components/Risk Factors/Patient Goals at Admission:  Personal Goals and Risk Factors at Admission - 10/20/22 1527       Core Components/Risk Factors/Patient Goals on Admission    Weight Management Yes    Intervention Weight Management: Develop a combined nutrition and exercise program designed to reach desired caloric intake, while maintaining appropriate intake of nutrient and fiber, sodium and fats, and appropriate energy expenditure required for the weight goal.;Weight Management: Provide education and appropriate resources to help participant work on and attain dietary goals.    Admit Weight 166 lb 1.6 oz (75.3 kg)    Goal Weight: Short Term 166 lb (75.3 kg)    Goal Weight: Long Term 166 lb (75.3 kg)    Expected Outcomes Short Term: Continue to assess and modify interventions until short term weight is achieved;Long Term: Adherence to nutrition and physical activity/exercise program aimed toward attainment of established weight goal;Weight Maintenance: Understanding of the daily nutrition guidelines, which includes 25-35% calories from fat, 7% or less cal from saturated fats, less than 25m cholesterol, less than 1.5gm of sodium, & 5 or more servings of fruits and vegetables daily;Understanding recommendations for meals to include 15-35% energy as protein, 25-35% energy from fat, 35-60% energy from carbohydrates, less than 2036mof dietary cholesterol, 20-35 gm  of total fiber daily;Understanding of distribution of calorie intake throughout the day with the consumption of 4-5 meals/snacks    Hypertension Yes    Intervention Provide education on lifestyle modifcations including regular physical activity/exercise, weight management, moderate sodium restriction and increased consumption of fresh fruit, vegetables, and low fat dairy, alcohol moderation, and smoking cessation.;Monitor prescription use compliance.    Expected Outcomes Short Term: Continued assessment and intervention until BP is < 140/9070mG in hypertensive participants. < 130/62m83m in hypertensive participants with diabetes, heart failure or chronic kidney disease.;Long Term: Maintenance of blood pressure at goal levels.    Lipids Yes    Intervention Provide education and support for participant on nutrition & aerobic/resistive exercise along with prescribed medications to achieve LDL <70mg67mL >40mg.33mExpected Outcomes Short Term: Participant states understanding of desired cholesterol values and is compliant with medications prescribed. Participant is following exercise prescription and nutrition guidelines.;Long Term: Cholesterol controlled with medications as prescribed, with individualized exercise RX and with personalized nutrition plan. Value goals: LDL < 70mg, 38m> 40 mg.             Education:Diabetes - Individual verbal and written instruction to review signs/symptoms of diabetes, desired ranges of glucose level fasting, after meals and with exercise. Acknowledge that pre and post exercise glucose checks will be done for  3 sessions at entry of program.   Core Components/Risk Factors/Patient Goals Review:   Goals and Risk Factor Review     Row Name 11/03/22 1734             Core Components/Risk Factors/Patient Goals Review   Personal Goals Review Hypertension       Review Resean  states that he is checking his blood pressure at home. He has done well since he started  the program and is wanting to get back to jogging. He is moving up his levels on each machine. He does not have a whole lot of questions at this time. Informed patient more on what we do here and what he can expect.       Expected Outcomes Short: maintain adequate blood pressure reading. Long: maintain blood pressure readings independently.                Core Components/Risk Factors/Patient Goals at Discharge (Final Review):   Goals and Risk Factor Review - 11/03/22 1734       Core Components/Risk Factors/Patient Goals Review   Personal Goals Review Hypertension    Review Eithen  states that he is checking his blood pressure at home. He has done well since he started the program and is wanting to get back to jogging. He is moving up his levels on each machine. He does not have a whole lot of questions at this time. Informed patient more on what we do here and what he can expect.    Expected Outcomes Short: maintain adequate blood pressure reading. Long: maintain blood pressure readings independently.             ITP Comments:  ITP Comments     Row Name 10/20/22 1019 10/20/22 1506 10/22/22 0823 10/22/22 1646 11/19/22 1053   ITP Comments Initial telephone orientation completed. Diagnosis can be found in [facility] 10/19. EP orientation scheduled for Monday 11/27 at 1:30 Completed 6MWT and gym orientation. Initial ITP created and sent for review to Dr. Emily Filbert, Medical Director. 30 Day review completed. Medical Director ITP review done, changes made as directed, and signed approval by Medical Director. New to program First full day of exercise!  Patient was oriented to gym and equipment including functions, settings, policies, and procedures.  Patient's individual exercise prescription and treatment plan were reviewed.  All starting workloads were established based on the results of the 6 minute walk test done at initial orientation visit.  The plan for exercise progression was also introduced  and progression will be customized based on patient's performance and goals. 30 Day review completed. Medical Director ITP review done, changes made as directed, and signed approval by Medical Director.    new to program            Comments:

## 2022-11-20 ENCOUNTER — Encounter: Payer: Managed Care, Other (non HMO) | Admitting: *Deleted

## 2022-11-20 DIAGNOSIS — Z952 Presence of prosthetic heart valve: Secondary | ICD-10-CM

## 2022-11-20 NOTE — Progress Notes (Signed)
Daily Session Note  Patient Details  Name: Antonio [name] MRN: [id number] Date of Birth: Mar 19, 1961 Referring Provider:   Flowsheet Row Cardiac Rehab from 10/20/2022 in [facility] Cardiac and Pulmonary Rehab  Referring Provider Paraschos       Encounter Date: 11/20/2022  Check In:  Session Check In - 11/20/22 1710       Check-In   Supervising physician immediately available to respond to emergencies See telemetry face sheet for immediately available ER MD    Location [facility]-Cardiac & Pulmonary Rehab    Staff Present Renita Papa, RN BSN;Joseph Tessie Fass, RCP,RRT,BSRT;Kara [place], MS, ACSM CEP, Exercise Physiologist    Virtual Visit No    Medication changes reported     No    Fall or balance concerns reported    No    Warm-up and Cool-down Performed on first and last piece of equipment    Resistance Training Performed Yes    VAD Patient? No    PAD/SET Patient? No      Pain Assessment   Currently in Pain? No/denies                Social History   Tobacco Use  Smoking Status Never  Smokeless Tobacco Never    Goals Met:  Independence with exercise equipment Exercise tolerated well No report of concerns or symptoms today Strength training completed today  Goals Unmet:  Not Applicable  Comments: Pt able to follow exercise prescription today without complaint.  Will continue to monitor for progression.    Dr. Emily Filbert is Medical Director for [place].  Dr. Ottie Glazier is Medical Director for [facility] Pulmonary Rehabilitation.

## 2022-11-26 ENCOUNTER — Encounter: Payer: Managed Care, Other (non HMO) | Attending: Cardiology | Admitting: *Deleted

## 2022-11-26 DIAGNOSIS — Z952 Presence of prosthetic heart valve: Secondary | ICD-10-CM | POA: Diagnosis not present

## 2022-11-26 DIAGNOSIS — Z48812 Encounter for surgical aftercare following surgery on the circulatory system: Secondary | ICD-10-CM | POA: Insufficient documentation

## 2022-11-26 NOTE — Progress Notes (Signed)
Daily Session Note  Patient Details  Name: Antonio [name] MRN: [id number] Date of Birth: [birth year] Referring Provider:   Flowsheet Row Cardiac Rehab from 10/20/2022 in [facility] Cardiac and Pulmonary Rehab  Referring Provider Paraschos       Encounter Date: 11/26/2022  Check In:  Session Check In - 11/26/22 1712       Check-In   Supervising physician immediately available to respond to emergencies See telemetry face sheet for immediately available ER MD    Location [facility]-Cardiac & Pulmonary Rehab    Staff Present Justin Mend, Jaci Carrel, BS, ACSM CEP, Exercise Physiologist;Shereen Marton Sherryll Burger, RN BSN    Virtual Visit No    Medication changes reported     No    Fall or balance concerns reported    No    Warm-up and Cool-down Performed on first and last piece of equipment    Resistance Training Performed Yes    VAD Patient? No    PAD/SET Patient? No      Pain Assessment   Currently in Pain? No/denies                Social History   Tobacco Use  Smoking Status Never  Smokeless Tobacco Never    Goals Met:  Independence with exercise equipment Exercise tolerated well No report of concerns or symptoms today Strength training completed today  Goals Unmet:  Not Applicable  Comments: Pt able to follow exercise prescription today without complaint.  Will continue to monitor for progression.    Dr. Emily Filbert is Medical Director for [place].  Dr. Ottie Glazier is Medical Director for [facility] Pulmonary Rehabilitation.

## 2022-11-27 ENCOUNTER — Encounter: Payer: Managed Care, Other (non HMO) | Admitting: *Deleted

## 2022-11-27 DIAGNOSIS — Z952 Presence of prosthetic heart valve: Secondary | ICD-10-CM | POA: Diagnosis not present

## 2022-11-27 NOTE — Progress Notes (Signed)
Daily Session Note  Patient Details  Name: Antonio [name] MRN: [id number] Date of Birth: Aug 25, 1961 Referring Provider:   Flowsheet Row Cardiac Rehab from 10/20/2022 in [facility] Cardiac and Pulmonary Rehab  Referring Provider Paraschos       Encounter Date: 11/27/2022  Check In:  Session Check In - 11/27/22 1711       Check-In   Supervising physician immediately available to respond to emergencies See telemetry face sheet for immediately available ER MD    Location [facility]-Cardiac & Pulmonary Rehab    Staff Present Renita Papa, RN BSN;Joseph Tessie Fass, RCP,RRT,BSRT;Noah [place], [place], Exercise Physiologist    Virtual Visit No    Medication changes reported     Yes    Comments added tambocor bid    Fall or balance concerns reported    No    Warm-up and Cool-down Performed on first and last piece of equipment    Resistance Training Performed Yes    VAD Patient? No    PAD/SET Patient? No      Pain Assessment   Currently in Pain? No/denies                Social History   Tobacco Use  Smoking Status Never  Smokeless Tobacco Never    Goals Met:  Independence with exercise equipment Exercise tolerated well No report of concerns or symptoms today Strength training completed today  Goals Unmet:  Not Applicable  Comments: Pt able to follow exercise prescription today without complaint.  Will continue to monitor for progression.    Dr. Emily Filbert is Medical Director for [place].  Dr. Ottie Glazier is Medical Director for [facility] Pulmonary Rehabilitation.

## 2022-12-01 ENCOUNTER — Encounter: Payer: Managed Care, Other (non HMO) | Admitting: *Deleted

## 2022-12-01 DIAGNOSIS — Z952 Presence of prosthetic heart valve: Secondary | ICD-10-CM | POA: Diagnosis not present

## 2022-12-01 NOTE — Progress Notes (Signed)
Daily Session Note  Patient Details  Name: NOLE ROBEY MRN: [id number] Date of Birth: 1961/11/04 Referring Provider:   Flowsheet Row Cardiac Rehab from 10/20/2022 in [facility] Cardiac and Pulmonary Rehab  Referring Provider Paraschos       Encounter Date: 12/01/2022  Check In:  Session Check In - 12/01/22 1717       Check-In   Supervising physician immediately available to respond to emergencies See telemetry face sheet for immediately available ER MD    Location [facility]-Cardiac & Pulmonary Rehab    Staff Present Renita Papa, RN BSN;Joseph Tessie Fass, RCP,RRT,BSRT;Noah [place], [place], Exercise Physiologist    Virtual Visit No    Medication changes reported     No    Fall or balance concerns reported    No    Warm-up and Cool-down Performed on first and last piece of equipment    Resistance Training Performed Yes    VAD Patient? No    PAD/SET Patient? No      Pain Assessment   Currently in Pain? No/denies                Social History   Tobacco Use  Smoking Status Never  Smokeless Tobacco Never    Goals Met:  Independence with exercise equipment Exercise tolerated well No report of concerns or symptoms today Strength training completed today  Goals Unmet:  Not Applicable  Comments: Pt able to follow exercise prescription today without complaint.  Will continue to monitor for progression.    Dr. Emily Filbert is Medical Director for [place].  Dr. Ottie Glazier is Medical Director for [facility] Pulmonary Rehabilitation.

## 2022-12-03 ENCOUNTER — Other Ambulatory Visit: Payer: Self-pay

## 2022-12-03 ENCOUNTER — Encounter
Admission: RE | Disposition: A | Discharge status: Home / Self Care | Payer: Self-pay | Source: Home / Self Care | Attending: Cardiology

## 2022-12-03 ENCOUNTER — Ambulatory Visit: Payer: Managed Care, Other (non HMO) | Admitting: Urgent Care

## 2022-12-03 ENCOUNTER — Encounter: Payer: Self-pay | Admitting: Cardiology

## 2022-12-03 ENCOUNTER — Ambulatory Visit
Admission: RE | Admit: 2022-12-03 | Discharge: 2022-12-03 | Disposition: A | Discharge status: Home / Self Care | Payer: Managed Care, Other (non HMO) | Attending: Cardiology | Admitting: Cardiology

## 2022-12-03 DIAGNOSIS — Z8546 Personal history of malignant neoplasm of prostate: Secondary | ICD-10-CM | POA: Diagnosis not present

## 2022-12-03 DIAGNOSIS — I4510 Unspecified right bundle-branch block: Secondary | ICD-10-CM | POA: Diagnosis not present

## 2022-12-03 DIAGNOSIS — Z79899 Other long term (current) drug therapy: Secondary | ICD-10-CM | POA: Insufficient documentation

## 2022-12-03 DIAGNOSIS — I110 Hypertensive heart disease with heart failure: Secondary | ICD-10-CM | POA: Diagnosis not present

## 2022-12-03 DIAGNOSIS — I4891 Unspecified atrial fibrillation: Secondary | ICD-10-CM | POA: Diagnosis not present

## 2022-12-03 DIAGNOSIS — Z952 Presence of prosthetic heart valve: Secondary | ICD-10-CM | POA: Insufficient documentation

## 2022-12-03 DIAGNOSIS — I5032 Chronic diastolic (congestive) heart failure: Secondary | ICD-10-CM | POA: Insufficient documentation

## 2022-12-03 DIAGNOSIS — I351 Nonrheumatic aortic (valve) insufficiency: Secondary | ICD-10-CM | POA: Diagnosis not present

## 2022-12-03 DIAGNOSIS — Z953 Presence of xenogenic heart valve: Secondary | ICD-10-CM

## 2022-12-03 DIAGNOSIS — E785 Hyperlipidemia, unspecified: Secondary | ICD-10-CM | POA: Insufficient documentation

## 2022-12-03 DIAGNOSIS — I4892 Unspecified atrial flutter: Secondary | ICD-10-CM | POA: Insufficient documentation

## 2022-12-03 DIAGNOSIS — I371 Nonrheumatic pulmonary valve insufficiency: Secondary | ICD-10-CM | POA: Diagnosis not present

## 2022-12-03 HISTORY — PX: CARDIOVERSION: SHX1299

## 2022-12-03 SURGERY — CARDIOVERSION
Anesthesia: General

## 2022-12-03 MED ORDER — ACETAMINOPHEN 325 MG PO TABS: 650.0000 mg | ORAL_TABLET | Freq: Once | ORAL | Status: DC | PRN

## 2022-12-03 MED ORDER — ONDANSETRON HCL 4 MG/2ML IJ SOLN: 4.0000 mg | Freq: Once | INTRAMUSCULAR | Status: DC | PRN

## 2022-12-03 MED ORDER — ACETAMINOPHEN 160 MG/5ML PO SOLN: 325.0000 mg | ORAL | Status: DC | PRN

## 2022-12-03 MED ORDER — PROPOFOL 10 MG/ML IV BOLUS
INTRAVENOUS | Status: DC | PRN
  Administered 2022-12-03: 20 mg via INTRAVENOUS
  Administered 2022-12-03: 70 mg via INTRAVENOUS
  Administered 2022-12-03: 20 mg via INTRAVENOUS

## 2022-12-03 MED ORDER — SODIUM CHLORIDE 0.9 % IV SOLN: INTRAVENOUS | Status: DC

## 2022-12-03 NOTE — Brief Op Note (Signed)
[facility] Cardiology   12/03/2022                     8:06 AM  PATIENT:  [name]    PRE-OPERATIVE DIAGNOSIS:  Cardioversion   AFib   Dr Sharen Hones oks  POST-OPERATIVE DIAGNOSIS:  Same  PROCEDURE:  CARDIOVERSION  SURGEON:  Isaias Cowman, MD    ANESTHESIA:     PREOPERATIVE INDICATIONS:  Antonio [name] is a  62 y.o. male with a diagnosis of Cardioversion   AFib   Dr Sharen Hones oks who failed conservative measures and elected for surgical management.    The risks benefits and alternatives were discussed with the patient preoperatively including but not limited to the risks of infection, bleeding, cardiopulmonary complications, the need for revision surgery, among others, and the patient was willing to proceed.   OPERATIVE PROCEDURE: The patient presented in the fasting state.  ECG revealed atrial flutter at 120 bpm.  The patient received 120 mg of propofol.  Synchronized electrical cardioversion was performed with 75 J with successful conversion to sinus rhythm.  There were no periprocedural complications.

## 2022-12-03 NOTE — Anesthesia Preprocedure Evaluation (Addendum)
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)  Rhythm:Irregular Rate:Normal + Systolic murmurs A flutter  Echo 09/16/22:  NORMAL LEFT VENTRICULAR SYSTOLIC FUNCTION WITH MILD LVH    ELEVATED LA PRESSURES WITH DIASTOLIC DYSFUNCTION    NORMAL RIGHT VENTRICULAR SYSTOLIC FUNCTION    VALVULAR REGURGITATION: SEVERE AR, MILD MR, SEVERE PR, MILD TR    PROSTHETIC VALVE(S): BIOPROSTHETIC AoV    SEVERE ECCENTRIC AORTIC REGURGITATION WITH AR PHT = [id number], HOLODIASTOLIC    REVERSAL OF FLOW IN DESCENDING AORTA    BIOPROSTHETIC AVR APPEARS THICKENED, CANNOT EXCLUDE ENDOCARDITIS    MODERATE -SEVERE PULMONIC REGURGITATION    Neuro/Psych negative neurological ROS     GI/Hepatic negative GI ROS,,,  Endo/Other  negative endocrine ROS    Renal/GU negative Renal ROS   Prostate CA    Musculoskeletal   Abdominal   Peds  Hematology negative hematology ROS (+)   Anesthesia Other Findings Cardiology note 11/27/22:  62 y.o. male with  1. Need for vaccination   62 year old gentleman status post AVR with bioprosthetic aortic valve, with aortic aneurysm repair, appears overall stable with recent cardiac MRI revealing well-seated aortic valve bioprosthesis, mild aortic root dilatation which is stable, and normal left ventricular function. 2D echocardiogram 05/03/2021  revealed normal left ventricular function, with LVEF greater than 55%, with stable appearing bioprosthetic aortic valve with trivial aortic insufficiency. The patient has hyperlipidemia, well controlled on atorvastatin. The patient reports occasional palpitations. Previous 24 hour Holter monitor reveals frequent PVCs and occasional triplets, and the patient was had overall clinical improvement on metoprolol succinate. ECG 04/09/2022 revealed atrial fibrillation with controlled ventricular rate. 7-day Holter monitor 04/09/2022 - 04/15/2022 revealed predominant sinus bradycardia with mean heart rate of 53 bpm, atrial fibrillation burden of 1% with longest episode 1 hour and 33 minutes. The patient has CHA2DS2-VASc score of 0. 2D echocardiogram 04/18/2022 revealed normal left ventricular function, with LVEF greater than 55%, well-seated aortic valve bioprosthesis, with mild aortic insufficiency. The patient was admitted 09/09/2022 with acute diastolic congestive heart failure, acute kidney injury, in the setting of moderate to severe aortic insufficiency. The patient was transferred to [facility] where he underwent redo AVR with 23 mm On-X mechanical prosthesis complicated by postoperative atrial fibrillation. ECG 11/04/2022 revealed atrial flutter at 72 bpm. 72-hour Holter monitor 11/04/2022 - 11/07/2022 revealed predominant atrial flutter with mean heart rate 77 bpm with frequent premature ventricular contractions  Plan   1. Continue current medications 2. Counseled the patient about low-cholesterol diet 3. Low-fat and cholesterol diet printed instructions given to the patient 4. Continue warfarin, target INR 2.0-3.0 x 3 months, then 1.5-2.0 5. Continue Cardizem CD 120 mg daily 6. Add flecainide 50 mg twice daily 7. Check INR today 8. Proceed with electrical cardioversion. The risk, benefits alternatives of cardioversion were explained to the patient and informed written consent was obtained.  No orders of the  defined types were placed in this  encounter.  Return in about 1 week (around 12/04/2022), or after cardioversion.    Reproductive/Obstetrics                             Anesthesia Physical Anesthesia Plan  ASA: 3  Anesthesia Plan: General   Post-op Pain Management:    Induction: Intravenous  PONV Risk Score and Plan: 2 and Propofol infusion, TIVA and Treatment may vary due to age or medical condition  Airway Management Planned: Natural Airway  Additional Equipment:   Intra-op Plan:   Post-operative Plan:   Informed Consent: I have reviewed the patients History and Physical, chart, labs and discussed the procedure including the risks, benefits and alternatives for the proposed anesthesia with the patient or authorized representative who has indicated his/her understanding and acceptance.       Plan Discussed with: CRNA  Anesthesia Plan Comments: (LMA/GETA backup discussed.  Patient consented for risks of anesthesia including but not limited to:  - adverse reactions to medications - damage to eyes, teeth, lips or other oral mucosa - nerve damage due to positioning  - sore throat or hoarseness - damage to heart, brain, nerves, lungs, other parts of body or loss of life  Informed patient about role of CRNA in peri- and intra-operative care.  Patient voiced understanding.)        Anesthesia Quick Evaluation

## 2022-12-03 NOTE — Anesthesia Postprocedure Evaluation (Signed)
Anesthesia Post Note  Patient: Antonio [name]  Procedure(s) Performed: CARDIOVERSION  Patient location during evaluation: PACU Anesthesia Type: General Level of consciousness: awake and alert, oriented and patient cooperative Pain management: pain level controlled Vital Signs Assessment: post-procedure vital signs reviewed and stable Respiratory status: spontaneous breathing, nonlabored ventilation and respiratory function stable Cardiovascular status: blood pressure returned to baseline and stable Postop Assessment: adequate PO intake Anesthetic complications: no   No notable events documented.   Last Vitals:  Vitals:   12/03/22 0800 12/03/22 0815  BP: 115/82 119/82  Pulse: 82 79  Resp: 10 18  Temp:    SpO2: 99% 98%    Last Pain:  Vitals:   12/03/22 0815  TempSrc:   PainSc: 0-No pain                 Darrin Nipper

## 2022-12-03 NOTE — Transfer of Care (Signed)
Immediate Anesthesia Transfer of Care Note  Patient: Antonio [name]  Procedure(s) Performed: CARDIOVERSION  Patient Location: PACU  Anesthesia Type:General  Level of Consciousness: awake, alert , and oriented  Airway & Oxygen Therapy: Patient Spontanous Breathing  Post-op Assessment: Report given to RN and Post -op Vital signs reviewed and stable  Post vital signs: Reviewed and stable  Last Vitals:  Vitals Value Taken Time  BP 109/82 12/03/22 0754  Temp    Pulse 80 12/03/22 0754  Resp 15 12/03/22 0754  SpO2 96 % 12/03/22 0754  Vitals shown include unvalidated device data.  Last Pain:  Vitals:   12/03/22 0729  TempSrc: Oral  PainSc: 0-No pain         Complications: No notable events documented.

## 2022-12-11 ENCOUNTER — Encounter: Payer: Managed Care, Other (non HMO) | Admitting: *Deleted

## 2022-12-11 DIAGNOSIS — Z952 Presence of prosthetic heart valve: Secondary | ICD-10-CM

## 2022-12-11 NOTE — Progress Notes (Signed)
Daily Session Note  Patient Details  Name: Antonio [name] MRN: [id number] Date of Birth: 1960/12/14 Referring Provider:   Flowsheet Row Cardiac Rehab from 10/20/2022 in [facility] Cardiac and Pulmonary Rehab  Referring Provider Paraschos       Encounter Date: 12/11/2022  Check In:  Session Check In - 12/11/22 1713       Check-In   Supervising physician immediately available to respond to emergencies See telemetry face sheet for immediately available ER MD    Location [facility]-Cardiac & Pulmonary Rehab    Staff Present Renita Papa, RN BSN;Joseph Tessie Fass, RCP,RRT,BSRT;Noah [place], [place], Exercise Physiologist    Virtual Visit No    Medication changes reported     No    Fall or balance concerns reported    No    Warm-up and Cool-down Performed on first and last piece of equipment    Resistance Training Performed Yes    VAD Patient? No    PAD/SET Patient? No      Pain Assessment   Currently in Pain? No/denies                Social History   Tobacco Use  Smoking Status Never  Smokeless Tobacco Never    Goals Met:  Independence with exercise equipment Exercise tolerated well No report of concerns or symptoms today Strength training completed today  Goals Unmet:  Not Applicable  Comments: Pt able to follow exercise prescription today without complaint.  Will continue to monitor for progression.    Dr. Emily Filbert is Medical Director for [place].  Dr. Ottie Glazier is Medical Director for [facility] Pulmonary Rehabilitation.

## 2022-12-15 ENCOUNTER — Encounter: Payer: Managed Care, Other (non HMO) | Admitting: *Deleted

## 2022-12-15 DIAGNOSIS — Z952 Presence of prosthetic heart valve: Secondary | ICD-10-CM | POA: Diagnosis not present

## 2022-12-15 NOTE — Progress Notes (Signed)
Daily Session Note  Patient Details  Name: Antonio [name] MRN: [id number] Date of Birth: 1961-01-25 Referring Provider:   Flowsheet Row Cardiac Rehab from 10/20/2022 in [facility] Cardiac [facility] Rehab  Referring Provider Paraschos       Encounter Date: 12/15/2022  Check In:  Session Check In - 12/15/22 1718       Check-In   Supervising physician immediately available to respond to emergencies See telemetry face sheet for immediately available ER MD    Location [facility]-Cardiac & Pulmonary Rehab    Staff Present Renita Papa, RN BSN;Joseph [place], RCP,RRT,BSRT;Kelly [place], [place], ACSM CEP, Exercise Physiologist    Virtual Visit No    Medication changes reported     No    Fall or balance concerns reported    No    Warm-up and Cool-down Performed on first and last piece of equipment    Resistance Training Performed Yes    VAD Patient? No    PAD/SET Patient? No      Pain Assessment   Currently in Pain? No/denies                Social History   Tobacco Use  Smoking Status Never  Smokeless Tobacco Never    Goals Met:  Independence with exercise equipment Exercise tolerated well No report of concerns or symptoms today Strength training completed today  Goals Unmet:  Not Applicable  Comments: Pt able to follow exercise prescription today without complaint.  Will continue to monitor for progression.    Dr. Emily Filbert is Medical Director for [place].  Dr. Ottie Glazier is Medical Director for [facility] Pulmonary Rehabilitation.

## 2022-12-17 ENCOUNTER — Encounter: Payer: Managed Care, Other (non HMO) | Admitting: *Deleted

## 2022-12-17 ENCOUNTER — Encounter: Payer: Self-pay | Admitting: *Deleted

## 2022-12-17 DIAGNOSIS — Z952 Presence of prosthetic heart valve: Secondary | ICD-10-CM | POA: Diagnosis not present

## 2022-12-17 NOTE — Progress Notes (Signed)
Cardiac Individual Treatment Plan  Patient Details  Name: Antonio [name] MRN: [id number] Date of Birth: [birth year] Referring Provider:   Flowsheet Row Cardiac Rehab from 10/20/2022 in [facility] Cardiac and Pulmonary Rehab  Referring Provider Paraschos       Initial Encounter Date:  Flowsheet Row Cardiac Rehab from 10/20/2022 in [facility] Cardiac and Pulmonary Rehab  Date 10/20/22       Visit Diagnosis: S/P AVR (aortic valve replacement)  Patient's Home Medications on Admission:  Current Outpatient Medications:    acetaminophen (TYLENOL) 500 MG tablet, Take 1,000 mg by mouth every 6 (six) hours as needed (for pain.)., Disp: , Rfl:    aspirin EC 81 MG tablet, Take 81 mg by mouth daily., Disp: , Rfl:    atorvastatin (LIPITOR) 10 MG tablet, Take 10 mg by mouth every evening. , Disp: , Rfl:    cetirizine (ZYRTEC) 10 MG tablet, Take 10 mg by mouth daily., Disp: , Rfl:    loratadine (CLARITIN) 10 MG tablet, Take by mouth., Disp: , Rfl:    midodrine (PROAMATINE) 10 MG tablet, Take 1 tablet (10 mg total) by mouth 3 (three) times daily with meals. (Patient not taking: Reported on 12/03/2022), Disp: , Rfl:    Polyethyl Glycol-Propyl Glycol (LUBRICANT EYE DROPS) 0.4-0.3 % SOLN, Place 1 drop into both eyes 3 (three) times daily as needed (dry/irritated eyes.)., Disp: , Rfl:    tadalafil (CIALIS) 5 MG tablet, Take 1-3 tablets (5-15 mg total) by mouth daily as needed for erectile dysfunction., Disp: 30 tablet, Rfl: 11   tamsulosin (FLOMAX) 0.4 MG CAPS capsule, TAKE 1 CAPSULE(0.4 MG) BY MOUTH DAILY AFTER SUPPER (Patient not taking: Reported on 12/03/2022), Disp: 90 capsule, Rfl: 3   traZODone (DESYREL) 50 MG tablet, Take 0.5 tablets (25 mg total) by mouth at bedtime as needed for sleep., Disp: , Rfl:    warfarin (COUMADIN) 2 MG tablet, Take 2 mg by mouth., Disp: , Rfl:   Past Medical History: Past Medical History:  Diagnosis Date   Aortic aneurysm ([place])    Aortic insufficiency 07/12/2014   Overview:  Mild to  moderate   H/O bicuspid aortic valve 11/04/2013   Heart palpitations 05/15/2017   Hypertension    Metabolic syndrome 99991111   OSA (obstructive sleep apnea) 06/11/2018   no CPAP/None suggested   Premature ventricular contractions 05/28/2017   S/P aortic valve replacement with bioprosthetic valve 11/04/2013   Overview:  With carpentier-edwards bovine pericardial valve and aortic aneurysm repair 03/26/06 by Dr Ysidro Evert at [facility]    Tobacco Use: Social History   Tobacco Use  Smoking Status Never  Smokeless Tobacco Never    Labs: Review Flowsheet        No data to display           Exercise Target Goals: Exercise Program Goal: Individual exercise prescription set using results from initial 6 min walk test and THRR while considering  patient's activity barriers and safety.   Exercise Prescription Goal: Initial exercise prescription builds to 30-45 minutes a day of aerobic activity, 2-3 days per week.  Home exercise guidelines will be given to patient during program as part of exercise prescription that the participant will acknowledge.   Education: Aerobic Exercise: - Group verbal and visual presentation on the components of exercise prescription. Introduces F.I.T.T principle from ACSM for exercise prescriptions.  Reviews F.I.T.T. principles of aerobic exercise including progression. Written material given at graduation.   Education: Resistance Exercise: - Group verbal and visual presentation on the components of  exercise prescription. Introduces F.I.T.T principle from ACSM for exercise prescriptions  Reviews F.I.T.T. principles of resistance exercise including progression. Written material given at graduation.    Education: Exercise & Equipment Safety: - Individual verbal instruction and demonstration of equipment use and safety with use of the equipment. Flowsheet Row Cardiac Rehab from 11/12/2022 in [facility] Cardiac and Pulmonary Rehab  Date 10/20/22  Educator [facility]  Instruction  Review Code 1- Verbalizes Understanding       Education: Exercise Physiology & General Exercise Guidelines: - Group verbal and written instruction with models to review the exercise physiology of the cardiovascular system and associated critical values. Provides general exercise guidelines with specific guidelines to those with heart or lung disease.    Education: Flexibility, Balance, Mind/Body Relaxation: - Group verbal and visual presentation with interactive activity on the components of exercise prescription. Introduces F.I.T.T principle from ACSM for exercise prescriptions. Reviews F.I.T.T. principles of flexibility and balance exercise training including progression. Also discusses the mind body connection.  Reviews various relaxation techniques to help reduce and manage stress (i.e. Deep breathing, progressive muscle relaxation, and visualization). Balance handout provided to take home. Written material given at graduation.   Activity Barriers & Risk Stratification:  Activity Barriers & Cardiac Risk Stratification - 10/20/22 1509       Activity Barriers & Cardiac Risk Stratification   Activity Barriers None    Cardiac Risk Stratification Moderate             6 Minute Walk:  6 Minute Walk     Row Name 10/20/22 1508         6 Minute Walk   Phase Initial     Distance 1320 feet     Walk Time 6 minutes     # of Rest Breaks 0     MPH 2.5     METS 3.85     RPE 11     Perceived Dyspnea  0     VO2 Peak 13.49     Symptoms No     Resting HR 78 bpm     Resting BP 136/86     Resting Oxygen Saturation  98 %     Exercise Oxygen Saturation  during 6 min walk 100 %     Max Ex. HR 104 bpm     Max Ex. BP 138/70     2 Minute Post BP 120/80              Oxygen Initial Assessment:   Oxygen Re-Evaluation:   Oxygen Discharge (Final Oxygen Re-Evaluation):   Initial Exercise Prescription:  Initial Exercise Prescription - 10/20/22 1500       Date of Initial  Exercise RX and Referring Provider   Date 10/20/22    Referring Provider Paraschos      Oxygen   Maintain Oxygen Saturation 88% or higher      Treadmill   MPH 2.7    Grade 1    Minutes 15    METs 3.44      NuStep   Level 3    SPM 80    Minutes 15    METs 3.85      REL-XR   Level 2    Speed 50    Minutes 15    METs 3.85      Biostep-RELP   Level 2    SPM 50    Minutes 15    METs 3.85      Prescription Details   Frequency (times  per week) 3    Duration Progress to 30 minutes of continuous aerobic without signs/symptoms of physical distress      Intensity   THRR 40-80% of Max Heartrate 110-143    Ratings of Perceived Exertion 11-13    Perceived Dyspnea 0-4      Progression   Progression Continue to progress workloads to maintain intensity without signs/symptoms of physical distress.      Resistance Training   Training Prescription Yes    Weight 3    Reps 10-15             Perform Capillary Blood Glucose checks as needed.  Exercise Prescription Changes:   Exercise Prescription Changes     Row Name 10/20/22 1500 11/03/22 1500 11/18/22 1500 12/01/22 1300 12/16/22 1300     Response to Exercise   Blood Pressure (Admit) 136/86 136/68 124/76 118/74 122/60   Blood Pressure (Exercise) 138/70 134/66 140/82 140/74 138/66   Blood Pressure (Exit) 120/80 126/68 108/62 102/60 102/64   Heart Rate (Admit) 78 bpm 81 bpm 64 bpm 75 bpm 71 bpm   Heart Rate (Exercise) 104 bpm 127 bpm 141 bpm 146 bpm 134 bpm   Heart Rate (Exit) 83 bpm 99 bpm 92 bpm 88 bpm 87 bpm   Oxygen Saturation (Admit) 98 % -- -- -- --   Oxygen Saturation (Exercise) 100 % -- -- -- --   Oxygen Saturation (Exit) 100 % -- -- -- --   Rating of Perceived Exertion (Exercise) '11 11 13 13 13   '$ Perceived Dyspnea (Exercise) 0 -- -- -- --   Symptoms none none none none none   Comments 6 MWT results 2nd full day of exercise -- -- --   Duration -- Continue with 30 min of aerobic exercise without  signs/symptoms of physical distress. Continue with 30 min of aerobic exercise without signs/symptoms of physical distress. Continue with 30 min of aerobic exercise without signs/symptoms of physical distress. Continue with 30 min of aerobic exercise without signs/symptoms of physical distress.   Intensity -- THRR unchanged THRR unchanged THRR unchanged THRR unchanged     Progression   Progression -- Continue to progress workloads to maintain intensity without signs/symptoms of physical distress. Continue to progress workloads to maintain intensity without signs/symptoms of physical distress. Continue to progress workloads to maintain intensity without signs/symptoms of physical distress. Continue to progress workloads to maintain intensity without signs/symptoms of physical distress.   Average METs -- 3.73 4.38 5.39 5.32     Resistance Training   Training Prescription -- Yes Yes Yes Yes   Weight -- 3 lb 3 lb 7 lb 7 lb   Reps -- 10-15 10-15 10-15 10-15     Interval Training   Interval Training -- No No Yes Yes   Equipment -- -- -- Treadmill Treadmill   Comments -- -- -- incline up to 5 % incline up to 5 %     Treadmill   MPH -- 3 3.5 3.5  intervals 3.7  intervals   Grade -- '1 2 5 5   '$ Minutes -- '15 15 15 15   '$ METs -- 3.71 4.65 -- 6.09     NuStep   Level -- -- -- 4 4   Minutes -- -- -- 15 15   METs -- -- -- 5.8 5     Elliptical   Level -- -- 2 -- --   Speed -- -- 4.5 -- --   Minutes -- -- 15 -- --  REL-XR   Level -- '3 5 7 7   '$ Minutes -- '15 15 15 15   '$ METs -- 3.8 -- 6.1 --     Biostep-RELP   Level -- 2 3 -- --   Minutes -- 15 15 -- --   METs -- 4 4 -- --     Oxygen   Maintain Oxygen Saturation -- 88% or higher 88% or higher 88% or higher 88% or higher            Exercise Comments:   Exercise Comments     Row Name 10/22/22 1646           Exercise Comments First full day of exercise!  Patient was oriented to gym and equipment including functions, settings,  policies, and procedures.  Patient's individual exercise prescription and treatment plan were reviewed.  All starting workloads were established based on the results of the 6 minute walk test done at initial orientation visit.  The plan for exercise progression was also introduced and progression will be customized based on patient's performance and goals.                Exercise Goals and Review:   Exercise Goals     Row Name 10/20/22 1516             Exercise Goals   Increase Physical Activity Yes       Intervention Provide advice, education, support and counseling about physical activity/exercise needs.;Develop an individualized exercise prescription for aerobic and resistive training based on initial evaluation findings, risk stratification, comorbidities and participant's personal goals.       Expected Outcomes Short Term: Attend rehab on a regular basis to increase amount of physical activity.;Long Term: Add in home exercise to make exercise part of routine and to increase amount of physical activity.;Long Term: Exercising regularly at least 3-5 days a week.       Increase Strength and Stamina Yes       Intervention Provide advice, education, support and counseling about physical activity/exercise needs.;Develop an individualized exercise prescription for aerobic and resistive training based on initial evaluation findings, risk stratification, comorbidities and participant's personal goals.       Expected Outcomes Short Term: Increase workloads from initial exercise prescription for resistance, speed, and METs.;Short Term: Perform resistance training exercises routinely during rehab and add in resistance training at home;Long Term: Improve cardiorespiratory fitness, muscular endurance and strength as measured by increased METs and functional capacity (6MWT)       Able to understand and use rate of perceived exertion (RPE) scale Yes       Intervention Provide education and explanation  on how to use RPE scale       Expected Outcomes Short Term: Able to use RPE daily in rehab to express subjective intensity level;Long Term:  Able to use RPE to guide intensity level when exercising independently       Able to understand and use Dyspnea scale Yes       Intervention Provide education and explanation on how to use Dyspnea scale       Expected Outcomes Short Term: Able to use Dyspnea scale daily in rehab to express subjective sense of shortness of breath during exertion;Long Term: Able to use Dyspnea scale to guide intensity level when exercising independently       Knowledge and understanding of Target Heart Rate Range (THRR) Yes       Intervention Provide education and explanation of THRR including how the  numbers were predicted and where they are located for reference       Expected Outcomes Short Term: Able to state/look up THRR;Long Term: Able to use THRR to govern intensity when exercising independently;Short Term: Able to use daily as guideline for intensity in rehab       Able to check pulse independently Yes       Intervention Provide education and demonstration on how to check pulse in carotid and radial arteries.;Review the importance of being able to check your own pulse for safety during independent exercise       Expected Outcomes Short Term: Able to explain why pulse checking is important during independent exercise       Understanding of Exercise Prescription Yes       Intervention Provide education, explanation, and written materials on patient's individual exercise prescription       Expected Outcomes Short Term: Able to explain program exercise prescription;Long Term: Able to explain home exercise prescription to exercise independently                Exercise Goals Re-Evaluation :  Exercise Goals Re-Evaluation     Row Name 10/22/22 1646 11/03/22 1554 11/18/22 1510 12/01/22 1348 12/11/22 1720     Exercise Goal Re-Evaluation   Exercise Goals Review Increase  Physical Activity;Able to understand and use rate of perceived exertion (RPE) scale;Knowledge and understanding of Target Heart Rate Range (THRR);Understanding of Exercise Prescription;Increase Strength and Stamina;Able to check pulse independently Increase Physical Activity;Increase Strength and Stamina;Understanding of Exercise Prescription Increase Physical Activity;Increase Strength and Stamina;Understanding of Exercise Prescription Increase Physical Activity;Increase Strength and Stamina;Understanding of Exercise Prescription Increase Physical Activity;Increase Strength and Stamina;Understanding of Exercise Prescription   Comments Reviewed RPE scale, THR and program prescription with pt today.  Pt voiced understanding and was given a copy of goals to take home. Antonio [name] is off to a good start with rehab for the first couple of sessions he has been here. He increased to 3 mph/ 1% incline on the treadmill and RPEs were on the lighter end. He hit his THR both sessions thus far. We will continue to monitor as he progresses in the program. Antonio [name] continues to do well in rehab. He recently increased his overall average MET level to 4.38 METs. He also improved to level 5 on the XR and level 3 on the biostep. he increased his workload on the treadmill as well, to a speed of 3.5 mph and an incline of 2%. We will continue to monitor his progress in the program. Antonio [name] continues to do well in rehab. He started doing intervals on the treadmill, going up to 5% incline with a steady state speed of 3.5 mph. He also increased to level 7 on the elliptical and is now using 7 lbs for handweights. He is most definitely hitting his THR each time. We will continue to monitor as he progresses in the program. Antonio [name] states that he is doing well in rehab. He states he feels he could do more but his restrictions are keeping him from doing so. He also states that he has increased his strength and stamina. He also reports feeling better mentally  since starting the program. Antonio [name] states that he has been walking 3-4 miles at least 3 times a week on his days away from rehab.   Expected Outcomes Short: Use RPE daily to regulate intensity.  Long: Follow program prescription in THR. Short: Follow exercise prescription, increase workload on seated machines slowly Long: Increase overall MET  level Short: Continue to increase workloads. Long: Continue to improve strength and stamina. Short: Continue to work on intervals with treadmill, add to XR Long: Continue to increase overall MET level Short: Continue to walk on days away from rehab. Long: Continue to improve strength and stamina.    [place] Name 12/16/22 1351             Exercise Goal Re-Evaluation   Exercise Goals Review Increase Physical Activity;Increase Strength and Stamina;Understanding of Exercise Prescription       Comments Antonio [name] continues to do well in rehab. He has consistently worked at an average MET level above 5 METs. He also has continued to do intervals of incline on the treadmill from 3-5% at an increased speed of 3.7 mph. He is doing well with 7 lb hand weights for resistance training as well. We will continue to monitor his progress.       Expected Outcomes Short: Continue interval training on the treadmill Long: Continue to improve strength and stamina.                Discharge Exercise Prescription (Final Exercise Prescription Changes):  Exercise Prescription Changes - 12/16/22 1300       Response to Exercise   Blood Pressure (Admit) 122/60    Blood Pressure (Exercise) 138/66    Blood Pressure (Exit) 102/64    Heart Rate (Admit) 71 bpm    Heart Rate (Exercise) 134 bpm    Heart Rate (Exit) 87 bpm    Rating of Perceived Exertion (Exercise) 13    Symptoms none    Duration Continue with 30 min of aerobic exercise without signs/symptoms of physical distress.    Intensity THRR unchanged      Progression   Progression Continue to progress workloads to maintain  intensity without signs/symptoms of physical distress.    Average METs 5.32      Resistance Training   Training Prescription Yes    Weight 7 lb    Reps 10-15      Interval Training   Interval Training Yes    Equipment Treadmill    Comments incline up to 5 %      Treadmill   MPH 3.7   intervals   Grade 5    Minutes 15    METs 6.09      NuStep   Level 4    Minutes 15    METs 5      REL-XR   Level 7    Minutes 15      Oxygen   Maintain Oxygen Saturation 88% or higher             Nutrition:  Target Goals: Understanding of nutrition guidelines, daily intake of sodium '1500mg'$ , cholesterol '200mg'$ , calories 30% from fat and 7% or less from saturated fats, daily to have 5 or more servings of fruits and vegetables.  Education: All About Nutrition: -Group instruction provided by verbal, written material, interactive activities, discussions, models, and posters to present general guidelines for heart healthy nutrition including fat, fiber, MyPlate, the role of sodium in heart healthy nutrition, utilization of the nutrition label, and utilization of this knowledge for meal planning. Follow up email sent as well. Written material given at graduation. Flowsheet Row Cardiac Rehab from 11/12/2022 in [facility] Cardiac and Pulmonary Rehab  Date 10/22/22  Educator [facility]  Instruction Review Code 1- Verbalizes Understanding       Biometrics:  Pre Biometrics - 10/20/22 1517       Pre  Biometrics   Height '5\' 10"'$  (1.778 m)    Weight 166 lb 1.6 oz (75.3 kg)    Waist Circumference 32 inches    Hip Circumference 38 inches    Waist to Hip Ratio 0.84 %    BMI (Calculated) 23.83    Single Leg Stand 30 seconds              Nutrition Therapy Plan and Nutrition Goals:  Nutrition Therapy & Goals - 10/20/22 1432       Nutrition Therapy   Diet Heart healthy, low Na    Drug/Food Interactions Coumadin/Vit K    Protein (specify units) 60-70g    Fiber 30 grams    Whole Grain Foods 3  servings    Saturated Fats 16 max. grams    Fruits and Vegetables 8 servings/day    Sodium 2 grams      Personal Nutrition Goals   Nutrition Goal ST: re-intoduce vitamin K rich food in consistent amounts with his healthcare team, improve food variety such as eating the rainbow and varying protein, include at least 1 source of healthy fat at most meals like nuts/seeds in salad LT: Follow MyPlate guide for most meals, continue with heart healthy eating pattern, increase variety    Comments 62 y.o. M admitted to cardiac rehab s/p AVR. PMHx includes acute CHF, a.fib, aortic insufficiency, OSA, dyslipidemia, prostate cancer, metabolic syndrome, s/p AVR 2007. Relevant medications includes lipitor, warfarin, trazodone, midodrine, tadalafil. Emoni reports that he has tried dieting before, but kept "yoyo-ing", however he has found an eating pattern he could stick with which is lower in fat and carbohydrates, but he reports including carbohydrates packaged with fiber like smaller amounts of whole grains, fruit, and limited starchy vegetables. Discussed making sure to have a good source of healthy fat at most meals to make sure he can absorb fat soluble nutrients and to include energy and fiber-rich carbohydrates as well.  B: 1 whole egg and 2 egg whites with spinach, blueberries, and a piece of bacon. Coffee with some splenda. L: chicken (air fry or bake) with a salad D: chicken and non-starchy vegetables. he uses some spray to keep it from sticking and some olive oil as well as spices. He has been limiting salt. Drinks: water or unsweet tea with 1 diet coke per day. Reviewed heart healthy eating and warfarin MNT - Veronica was nervous about his vitamin K intake - discussed new guidelines on vitamin K consistency and provided handouts. Encouraged variety of foods.      Intervention Plan   Intervention Prescribe, educate and counsel regarding individualized specific dietary modifications aiming towards targeted core  components such as weight, hypertension, lipid management, diabetes, heart failure and other comorbidities.;Nutrition handout(s) given to patient.    Expected Outcomes Short Term Goal: Understand basic principles of dietary content, such as calories, fat, sodium, cholesterol and nutrients.;Short Term Goal: A plan has been developed with personal nutrition goals set during dietitian appointment.;Long Term Goal: Adherence to prescribed nutrition plan.             Nutrition Assessments:  MEDIFICTS Score Key: ?70 Need to make dietary changes  40-70 Heart Healthy Diet ? 40 Therapeutic Level Cholesterol Diet  Flowsheet Row Cardiac Rehab from 10/20/2022 in [facility] Cardiac and Pulmonary Rehab  Picture Your Plate Total Score on Admission 75      Picture Your Plate Scores: <74 Unhealthy dietary pattern with much room for improvement. 41-50 Dietary pattern unlikely to meet recommendations for good health  and room for improvement. 51-60 More healthful dietary pattern, with some room for improvement.  >60 Healthy dietary pattern, although there may be some specific behaviors that could be improved.    Nutrition Goals Re-Evaluation:  Nutrition Goals Re-Evaluation     Antonio [name] Name 11/03/22 1736 12/11/22 1727           Goals   Current Weight 168 lb (76.2 kg) --      Nutrition Goal No questions at this time. Antonio [name] states he is happy with where his diet is at right now. He states that he did have excess sugar with all the sweets over the holidays. However, he plans on doing better at cutting out sweets over the next month.      Comment Patient states that he is eating well and has no questions on his diet. Short: Cut down on sweet snacks. Long: Continue heart-healthy eating patterns.               Nutrition Goals Discharge (Final Nutrition Goals Re-Evaluation):  Nutrition Goals Re-Evaluation - 12/11/22 1727       Goals   Nutrition Goal Antonio [name] states he is happy with where his diet is at right  now. He states that he did have excess sugar with all the sweets over the holidays. However, he plans on doing better at cutting out sweets over the next month.    Comment Short: Cut down on sweet snacks. Long: Continue heart-healthy eating patterns.             Psychosocial: Target Goals: Acknowledge presence or absence of significant depression and/or stress, maximize coping skills, provide positive support system. Participant is able to verbalize types and ability to use techniques and skills needed for reducing stress and depression.   Education: Stress, Anxiety, and Depression - Group verbal and visual presentation to define topics covered.  Reviews how body is impacted by stress, anxiety, and depression.  Also discusses healthy ways to reduce stress and to treat/manage anxiety and depression.  Written material given at graduation.   Education: Sleep Hygiene -Provides group verbal and written instruction about how sleep can affect your health.  Define sleep hygiene, discuss sleep cycles and impact of sleep habits. Review good sleep hygiene tips.    Initial Review & Psychosocial Screening:  Initial Psych Review & Screening - 10/20/22 1010       Initial Review   Current issues with None Identified      Family Dynamics   Good Support System? Yes   wife, children, family     Barriers   Psychosocial barriers to participate in program There are no identifiable barriers or psychosocial needs.;The patient should benefit from training in stress management and relaxation.      Screening Interventions   Interventions Encouraged to exercise;Provide feedback about the scores to participant;To provide support and resources with identified psychosocial needs    Expected Outcomes Short Term goal: Utilizing psychosocial counselor, staff and physician to assist with identification of specific Stressors or current issues interfering with healing process. Setting desired goal for each stressor or  current issue identified.;Long Term Goal: Stressors or current issues are controlled or eliminated.;Short Term goal: Identification and review with participant of any Quality of Life or Depression concerns found by scoring the questionnaire.;Long Term goal: The participant improves quality of Life and PHQ9 Scores as seen by post scores and/or verbalization of changes             Quality of Life Scores:  Quality of Life - 10/20/22 1526       Quality of Life   Select Quality of Life      Quality of Life Scores   Health/Function Pre 26.67 %    Socioeconomic Pre 27.86 %    Psych/Spiritual Pre 29.29 %    Family Pre 27.5 %    GLOBAL Pre 27.57 %            Scores of 19 and below usually indicate a poorer quality of life in these areas.  A difference of  2-3 points is a clinically meaningful difference.  A difference of 2-3 points in the total score of the Quality of Life Index has been associated with significant improvement in overall quality of life, self-image, physical symptoms, and general health in studies assessing change in quality of life.  PHQ-9: Review Flowsheet       10/20/2022  Depression screen PHQ 2/9  Decreased Interest 0  Down, Depressed, Hopeless 0  PHQ - 2 Score 0  Altered sleeping 0  Tired, decreased energy 0  Change in appetite 0  Feeling bad or failure about yourself  0  Trouble concentrating 0  Moving slowly or fidgety/restless 0  Suicidal thoughts 0  PHQ-9 Score 0  Difficult doing work/chores Not difficult at all   Interpretation of Total Score  Total Score Depression Severity:  1-4 = Minimal depression, 5-9 = Mild depression, 10-14 = Moderate depression, 15-19 = Moderately severe depression, 20-27 = Severe depression   Psychosocial Evaluation and Intervention:  Psychosocial Evaluation - 10/20/22 1020       Psychosocial Evaluation & Interventions   Interventions Encouraged to exercise with the program and follow exercise prescription     Comments 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 He reports no stress concerns or sleep issues. He has a very supporting family and friend circle. He states recovery has been smooth so far. He is quite active and wants to get back to exercising like he previously was.    Expected Outcomes Short: attend cardiac rehab for education and exercise. Long: develop and maintian positive self care habits.    Continue Psychosocial Services  Follow up required by staff             Psychosocial Re-Evaluation:  Psychosocial Re-Evaluation     Branch Name 11/03/22 1737 12/11/22 1725           Psychosocial Re-Evaluation   Current issues with None Identified None Identified      Comments Patient reports no issues with their current mental states, sleep, stress, depression or anxiety. Will follow up with patient in a few weeks for any changes. Antonio [name] states that he is doing well mentally and has no current stressors. He says that he is happy with his job and states that He "is very blessed." He reports sleeping well also. He feels that exercise has really helped his mental state as well.      Expected Outcomes Short: Continue to exercise regularly to support mental health and notify staff of any changes. Long: maintain mental health and well being through teaching of rehab or prescribed medications independently. Short: Continue to exercise regularly to support mental health and notify staff of any changes. Long: maintain mental health and well being through teaching of rehab or prescribed medications independently.      Interventions Encouraged to attend Cardiac Rehabilitation for the exercise  Encouraged to attend Cardiac Rehabilitation for the exercise      Continue Psychosocial Services  Follow up required by staff Follow up required by staff               Psychosocial Discharge (Final Psychosocial Re-Evaluation):   Psychosocial Re-Evaluation - 12/11/22 1725       Psychosocial Re-Evaluation   Current issues with None Identified    Comments Antonio [name] states that he is doing well mentally and has no current stressors. He says that he is happy with his job and states that He "is very blessed." He reports sleeping well also. He feels that exercise has really helped his mental state as well.    Expected Outcomes Short: Continue to exercise regularly to support mental health and notify staff of any changes. Long: maintain mental health and well being through teaching of rehab or prescribed medications independently.    Interventions Encouraged to attend Cardiac Rehabilitation for the exercise    Continue Psychosocial Services  Follow up required by staff             Vocational Rehabilitation: Provide vocational rehab assistance to qualifying candidates.   Vocational Rehab Evaluation & Intervention:  Vocational Rehab - 10/20/22 1010       Initial Vocational Rehab Evaluation & Intervention   Assessment shows need for Vocational Rehabilitation No             Education: Education Goals: Education classes will be provided on a variety of topics geared toward better understanding of heart health and risk factor modification. Participant will state understanding/return demonstration of topics presented as noted by education test scores.  Learning Barriers/Preferences:  Learning Barriers/Preferences - 10/20/22 1020       Learning Barriers/Preferences   Learning Barriers None    Learning Preferences None             General Cardiac Education Topics:  AED/CPR: - Group verbal and written instruction with the use of models to demonstrate the basic use of the AED with the basic ABC's of resuscitation.   Anatomy and Cardiac Procedures: - Group verbal and visual presentation and models provide information about basic cardiac anatomy and function. Reviews the testing methods done to diagnose heart  disease and the outcomes of the test results. Describes the treatment choices: Medical Management, Angioplasty, or Coronary Bypass Surgery for treating various heart conditions including Myocardial Infarction, Angina, Valve Disease, and Cardiac Arrhythmias.  Written material given at graduation. Flowsheet Row Cardiac Rehab from 11/12/2022 in [facility] Cardiac and Pulmonary Rehab  Education need identified 10/20/22       Medication Safety: - Group verbal and visual instruction to review commonly prescribed medications for heart and lung disease. Reviews the medication, class of the drug, and side effects. Includes the steps to properly store meds and maintain the prescription regimen.  Written material given at graduation.   Intimacy: - Group verbal instruction through game format to discuss how heart and lung disease can affect sexual intimacy. Written material given at graduation..   Know Your Numbers and Heart Failure: - Group verbal and visual instruction to discuss disease risk factors for cardiac and pulmonary disease and treatment options.  Reviews associated critical values for Overweight/Obesity, Hypertension, Cholesterol, and Diabetes.  Discusses basics of heart failure: signs/symptoms and treatments.  Introduces Heart Failure Zone chart for action plan for heart failure.  Written material given at graduation. Flowsheet Row Cardiac Rehab from 11/12/2022 in [facility] Cardiac and Pulmonary Rehab  Date 11/12/22  Educator  SB  Instruction Review Code 1- Verbalizes Understanding       Infection Prevention: - Provides verbal and written material to individual with discussion of infection control including proper hand washing and proper equipment cleaning during exercise session. Flowsheet Row Cardiac Rehab from 11/12/2022 in [facility] Cardiac and Pulmonary Rehab  Date 10/20/22  Educator [facility]  Instruction Review Code 1- Verbalizes Understanding       Falls Prevention: - Provides verbal and  written material to individual with discussion of falls prevention and safety. Flowsheet Row Cardiac Rehab from 11/12/2022 in [facility] Cardiac and Pulmonary Rehab  Date 10/20/22  Educator [facility]  Instruction Review Code 1- Verbalizes Understanding       Other: -Provides group and verbal instruction on various topics (see comments)   Knowledge Questionnaire Score:  Knowledge Questionnaire Score - 10/20/22 1526       Knowledge Questionnaire Score   Pre Score 25/26             Core Components/Risk Factors/Patient Goals at Admission:  Personal Goals and Risk Factors at Admission - 10/20/22 1527       Core Components/Risk Factors/Patient Goals on Admission    Weight Management Yes    Intervention Weight Management: Develop a combined nutrition and exercise program designed to reach desired caloric intake, while maintaining appropriate intake of nutrient and fiber, sodium and fats, and appropriate energy expenditure required for the weight goal.;Weight Management: Provide education and appropriate resources to help participant work on and attain dietary goals.    Admit Weight 166 lb 1.6 oz (75.3 kg)    Goal Weight: Short Term 166 lb (75.3 kg)    Goal Weight: Long Term 166 lb (75.3 kg)    Expected Outcomes Short Term: Continue to assess and modify interventions until short term weight is achieved;Long Term: Adherence to nutrition and physical activity/exercise program aimed toward attainment of established weight goal;Weight Maintenance: Understanding of the daily nutrition guidelines, which includes 25-35% calories from fat, 7% or less cal from saturated fats, less than '200mg'$  cholesterol, less than 1.5gm of sodium, & 5 or more servings of fruits and vegetables daily;Understanding recommendations for meals to include 15-35% energy as protein, 25-35% energy from fat, 35-60% energy from carbohydrates, less than '200mg'$  of dietary cholesterol, 20-35 gm of total fiber daily;Understanding of  distribution of calorie intake throughout the day with the consumption of 4-5 meals/snacks    Hypertension Yes    Intervention Provide education on lifestyle modifcations including regular physical activity/exercise, weight management, moderate sodium restriction and increased consumption of fresh fruit, vegetables, and low fat dairy, alcohol moderation, and smoking cessation.;Monitor prescription use compliance.    Expected Outcomes Short Term: Continued assessment and intervention until BP is < 140/1m HG in hypertensive participants. < 130/868mHG in hypertensive participants with diabetes, heart failure or chronic kidney disease.;Long Term: Maintenance of blood pressure at goal levels.    Lipids Yes    Intervention Provide education and support for participant on nutrition & aerobic/resistive exercise along with prescribed medications to achieve LDL '70mg'$ , HDL >'40mg'$ .    Expected Outcomes Short Term: Participant states understanding of desired cholesterol values and is compliant with medications prescribed. Participant is following exercise prescription and nutrition guidelines.;Long Term: Cholesterol controlled with medications as prescribed, with individualized exercise RX and with personalized nutrition plan. Value goals: LDL < '70mg'$ , HDL > 40 mg.             Education:Diabetes - Individual verbal and written instruction to review signs/symptoms of diabetes, desired  ranges of glucose level fasting, after meals and with exercise. Acknowledge that pre and post exercise glucose checks will be done for 3 sessions at entry of program.   Core Components/Risk Factors/Patient Goals Review:   Goals and Risk Factor Review     Row Name 11/03/22 1734 12/11/22 1729           Core Components/Risk Factors/Patient Goals Review   Personal Goals Review Hypertension Hypertension      Review Antonio [name]  states that he is checking his blood pressure at home. He has done well since he started the program and  is wanting to get back to jogging. He is moving up his levels on each machine. He does not have a whole lot of questions at this time. Informed patient more on what we do here and what he can expect. Antonio [name] states that he is checking his blood pressure at home. He reports that his blood pressures are staying within normal ranges. His systolic pressure has been between 867-619, with a diastolic pressure of 50-93. He has done well since he started the program and is wanting to get back to jogging. He does not have a whole lot of questions at this time.      Expected Outcomes Short: maintain adequate blood pressure reading. Long: maintain blood pressure readings independently. Short: Continue to check BP at home. Long: Continue to manage lifestyle risk factors.               Core Components/Risk Factors/Patient Goals at Discharge (Final Review):   Goals and Risk Factor Review - 12/11/22 1729       Core Components/Risk Factors/Patient Goals Review   Personal Goals Review Hypertension    Review Antonio [name] states that he is checking his blood pressure at home. He reports that his blood pressures are staying within normal ranges. His systolic pressure has been between 267-124, with a diastolic pressure of 58-09. He has done well since he started the program and is wanting to get back to jogging. He does not have a whole lot of questions at this time.    Expected Outcomes Short: Continue to check BP at home. Long: Continue to manage lifestyle risk factors.             ITP Comments:  ITP Comments     Row Name 10/20/22 1019 10/20/22 1506 10/22/22 0823 10/22/22 1646 11/19/22 1053   ITP Comments Initial telephone orientation completed. Diagnosis can be found in [facility] 10/19. EP orientation scheduled for Monday 11/27 at 1:30 Completed 6MWT and gym orientation. Initial ITP created and sent for review to Dr. Emily Filbert, Medical Director. 30 Day review completed. Medical Director ITP review done, changes made as  directed, and signed approval by Medical Director. New to program First full day of exercise!  Patient was oriented to gym and equipment including functions, settings, policies, and procedures.  Patient's individual exercise prescription and treatment plan were reviewed.  All starting workloads were established based on the results of the 6 minute walk test done at initial orientation visit.  The plan for exercise progression was also introduced and progression will be customized based on patient's performance and goals. 30 Day review completed. Medical Director ITP review done, changes made as directed, and signed approval by Medical Director.    new to program    Row Name 12/17/22 1021           ITP Comments 30 Day review completed. Medical Director ITP review done, changes made as  directed, and signed approval by Medical Director.                Comments:

## 2022-12-17 NOTE — Progress Notes (Signed)
Daily Session Note  Patient Details  Name: Antonio [name] MRN: [id number] Date of Birth: [birth year] Referring Provider:   Flowsheet Row Cardiac Rehab from 10/20/2022 in [facility] Cardiac and Pulmonary Rehab  Referring Provider Paraschos       Encounter Date: 12/17/2022  Check In:  Session Check In - 12/17/22 1721       Check-In   Supervising physician immediately available to respond to emergencies See telemetry face sheet for immediately available ER MD    Location [facility]-Cardiac & Pulmonary Rehab    Staff Present Renita Papa, RN BSN;Joseph [place], RCP,RRT,BSRT;Kelly [place], [place], ACSM CEP, Exercise Physiologist    Virtual Visit No    Medication changes reported     No    Fall or balance concerns reported    No    Warm-up and Cool-down Performed on first and last piece of equipment    Resistance Training Performed Yes    VAD Patient? No    PAD/SET Patient? No      Pain Assessment   Currently in Pain? No/denies                Social History   Tobacco Use  Smoking Status Never  Smokeless Tobacco Never    Goals Met:  Independence with exercise equipment Exercise tolerated well No report of concerns or symptoms today Strength training completed today  Goals Unmet:  Not Applicable  Comments: Pt able to follow exercise prescription today without complaint.  Will continue to monitor for progression.    Dr. Emily Filbert is Medical Director for [place].  Dr. Ottie Glazier is Medical Director for [facility] Pulmonary Rehabilitation.

## 2022-12-18 ENCOUNTER — Encounter: Payer: Managed Care, Other (non HMO) | Admitting: *Deleted

## 2022-12-18 DIAGNOSIS — Z952 Presence of prosthetic heart valve: Secondary | ICD-10-CM | POA: Diagnosis not present

## 2022-12-18 NOTE — Progress Notes (Signed)
Daily Session Note  Patient Details  Name: Antonio [name] MRN: [id number] Date of Birth: August 18, 1961 Referring Provider:   Flowsheet Row Cardiac Rehab from 10/20/2022 in [facility] Cardiac and Pulmonary Rehab  Referring Provider Paraschos       Encounter Date: 12/18/2022  Check In:  Session Check In - 12/18/22 1718       Check-In   Supervising physician immediately available to respond to emergencies See telemetry face sheet for immediately available ER MD    Location [facility]-Cardiac & Pulmonary Rehab    Staff Present Renita Papa, RN BSN;Joseph Tessie Fass, RCP,RRT,BSRT;Noah [place], [place], Exercise Physiologist    Virtual Visit No    Medication changes reported     No    Fall or balance concerns reported    No    Warm-up and Cool-down Performed on first and last piece of equipment    Resistance Training Performed Yes    VAD Patient? No    PAD/SET Patient? No      Pain Assessment   Currently in Pain? No/denies                Social History   Tobacco Use  Smoking Status Never  Smokeless Tobacco Never    Goals Met:  Independence with exercise equipment Exercise tolerated well No report of concerns or symptoms today Strength training completed today  Goals Unmet:  Not Applicable  Comments: Pt able to follow exercise prescription today without complaint.  Will continue to monitor for progression.    Dr. Emily Filbert is Medical Director for [place].  Dr. Ottie Glazier is Medical Director for [facility] Pulmonary Rehabilitation.

## 2022-12-22 ENCOUNTER — Encounter: Payer: Managed Care, Other (non HMO) | Admitting: *Deleted

## 2022-12-22 DIAGNOSIS — Z952 Presence of prosthetic heart valve: Secondary | ICD-10-CM

## 2022-12-22 NOTE — Progress Notes (Signed)
Daily Session Note  Patient Details  Name: Antonio [name] MRN: [id number] Date of Birth: Mar 29, 1961 Referring Provider:   Flowsheet Row Cardiac Rehab from 10/20/2022 in [facility] Cardiac and Pulmonary Rehab  Referring Provider Paraschos       Encounter Date: 12/22/2022  Check In:  Session Check In - 12/22/22 1711       Check-In   Supervising physician immediately available to respond to emergencies See telemetry face sheet for immediately available ER MD    Location [facility]-Cardiac & Pulmonary Rehab    Staff Present Renita Papa, RN BSN;Joseph Tessie Fass, RCP,RRT,BSRT;Noah [place], [place], Exercise Physiologist    Virtual Visit No    Medication changes reported     No    Fall or balance concerns reported    No    Warm-up and Cool-down Performed on first and last piece of equipment    Resistance Training Performed Yes    VAD Patient? No    PAD/SET Patient? No      Pain Assessment   Currently in Pain? No/denies                Social History   Tobacco Use  Smoking Status Never  Smokeless Tobacco Never    Goals Met:  Independence with exercise equipment Exercise tolerated well No report of concerns or symptoms today Strength training completed today  Goals Unmet:  Not Applicable  Comments: Pt able to follow exercise prescription today without complaint.  Will continue to monitor for progression.    Dr. Emily Filbert is Medical Director for [facility].  Dr. Ottie Glazier is Medical Director for [facility] Pulmonary Rehabilitation.

## 2022-12-24 ENCOUNTER — Encounter: Payer: Managed Care, Other (non HMO) | Admitting: *Deleted

## 2022-12-24 DIAGNOSIS — Z952 Presence of prosthetic heart valve: Secondary | ICD-10-CM

## 2022-12-24 NOTE — Progress Notes (Signed)
Daily Session Note  Patient Details  Name: Antonio [name] MRN: [id number] Date of Birth: 12-28-1960 Referring Provider:   Flowsheet Row Cardiac Rehab from 10/20/2022 in [facility] Cardiac and Pulmonary Rehab  Referring Provider Paraschos       Encounter Date: 12/24/2022  Check In:  Session Check In - 12/24/22 1719       Check-In   Supervising physician immediately available to respond to emergencies See telemetry face sheet for immediately available ER MD    Location [facility]-Cardiac & Pulmonary Rehab    Staff Present Earlean Shawl, BS, ACSM CEP, Exercise Physiologist;Silverio Hagan Sherryll Burger, RN Odelia Gage, RN, ADN    Virtual Visit No    Medication changes reported     No    Fall or balance concerns reported    No    Warm-up and Cool-down Performed on first and last piece of equipment    Resistance Training Performed Yes    VAD Patient? No    PAD/SET Patient? No      Pain Assessment   Currently in Pain? No/denies                Social History   Tobacco Use  Smoking Status Never  Smokeless Tobacco Never    Goals Met:  Independence with exercise equipment Exercise tolerated well No report of concerns or symptoms today Strength training completed today  Goals Unmet:  Not Applicable  Comments: Pt able to follow exercise prescription today without complaint.  Will continue to monitor for progression.    Dr. Emily Filbert is Medical Director for [place].  Dr. Ottie Glazier is Medical Director for [facility] Pulmonary Rehabilitation.

## 2022-12-25 ENCOUNTER — Encounter: Payer: Managed Care, Other (non HMO) | Attending: Cardiology | Admitting: *Deleted

## 2022-12-25 DIAGNOSIS — Z952 Presence of prosthetic heart valve: Secondary | ICD-10-CM | POA: Diagnosis present

## 2022-12-25 DIAGNOSIS — Z48812 Encounter for surgical aftercare following surgery on the circulatory system: Secondary | ICD-10-CM | POA: Insufficient documentation

## 2022-12-25 NOTE — Progress Notes (Signed)
Daily Session Note  Patient Details  Name: Antonio [name] MRN: [id number] Date of Birth: 1961/06/13 Referring Provider:   Flowsheet Row Cardiac Rehab from 10/20/2022 in [facility] Cardiac and Pulmonary Rehab  Referring Provider Paraschos       Encounter Date: 12/25/2022  Check In:  Session Check In - 12/25/22 1712       Check-In   Supervising physician immediately available to respond to emergencies See telemetry face sheet for immediately available ER MD    Location [facility]-Cardiac & Pulmonary Rehab    Staff Present Renita Papa, RN BSN;Joseph Tessie Fass, RCP,RRT,BSRT;Noah [place], [place], Exercise Physiologist    Virtual Visit No    Medication changes reported     No    Fall or balance concerns reported    No    Warm-up and Cool-down Performed on first and last piece of equipment    Resistance Training Performed Yes    VAD Patient? No    PAD/SET Patient? No      Pain Assessment   Currently in Pain? No/denies                Social History   Tobacco Use  Smoking Status Never  Smokeless Tobacco Never    Goals Met:  Independence with exercise equipment Exercise tolerated well No report of concerns or symptoms today Strength training completed today  Goals Unmet:  Not Applicable  Comments: Pt able to follow exercise prescription today without complaint.  Will continue to monitor for progression.    Dr. Emily Filbert is Medical Director for [place].  Dr. Ottie Glazier is Medical Director for [facility] Pulmonary Rehabilitation.

## 2022-12-29 ENCOUNTER — Encounter: Payer: Managed Care, Other (non HMO) | Admitting: *Deleted

## 2022-12-29 DIAGNOSIS — Z952 Presence of prosthetic heart valve: Secondary | ICD-10-CM

## 2022-12-29 NOTE — Progress Notes (Signed)
Daily Session Note  Patient Details  Name: Antonio [name] MRN: [id number] Date of Birth: 28-Nov-1960 Referring Provider:   Flowsheet Row Cardiac Rehab from 10/20/2022 in [facility] Cardiac and Pulmonary Rehab  Referring Provider Paraschos       Encounter Date: 12/29/2022  Check In:  Session Check In - 12/29/22 1717       Check-In   Supervising physician immediately available to respond to emergencies See telemetry face sheet for immediately available ER MD    Location [facility]-Cardiac & Pulmonary Rehab    Staff Present Renita Papa, RN BSN;Noah Tickle, BS, Exercise Physiologist;Laureen Owens Shark, BS, RRT, CPFT    Virtual Visit No    Medication changes reported     No    Fall or balance concerns reported    No    Warm-up and Cool-down Performed on first and last piece of equipment    Resistance Training Performed Yes    VAD Patient? No    PAD/SET Patient? No      Pain Assessment   Currently in Pain? No/denies                Social History   Tobacco Use  Smoking Status Never  Smokeless Tobacco Never    Goals Met:  Independence with exercise equipment Exercise tolerated well No report of concerns or symptoms today Strength training completed today  Goals Unmet:  Not Applicable  Comments: Pt able to follow exercise prescription today without complaint.  Will continue to monitor for progression.    Dr. Emily Filbert is Medical Director for [place].  Dr. Ottie Glazier is Medical Director for [facility] Pulmonary Rehabilitation.

## 2022-12-31 ENCOUNTER — Encounter: Payer: Managed Care, Other (non HMO) | Admitting: *Deleted

## 2022-12-31 DIAGNOSIS — Z952 Presence of prosthetic heart valve: Secondary | ICD-10-CM | POA: Diagnosis not present

## 2022-12-31 NOTE — Progress Notes (Signed)
Daily Session Note  Patient Details  Name: Antonio [name] MRN: [id number] Date of Birth: 1961/11/01 Referring Provider:   Flowsheet Row Cardiac Rehab from 10/20/2022 in [facility] Cardiac and Pulmonary Rehab  Referring Provider Paraschos       Encounter Date: 12/31/2022  Check In:  Session Check In - 12/31/22 1719       Check-In   Supervising physician immediately available to respond to emergencies Virtual Visit, No Supervising Physician Required.    Location [facility]-Cardiac & Pulmonary Rehab    Staff Present Renita Papa, RN Odelia Gage, RN, Lorin Mercy, MS, ACSM CEP, Exercise Physiologist    Virtual Visit No    Medication changes reported     Yes    Comments off flecanide    Fall or balance concerns reported    No    Warm-up and Cool-down Performed on first and last piece of equipment    Resistance Training Performed Yes    VAD Patient? No    PAD/SET Patient? No      Pain Assessment   Currently in Pain? No/denies                Social History   Tobacco Use  Smoking Status Never  Smokeless Tobacco Never    Goals Met:  Independence with exercise equipment Exercise tolerated well No report of concerns or symptoms today Strength training completed today  Goals Unmet:  Not Applicable  Comments: Pt able to follow exercise prescription today without complaint.  Will continue to monitor for progression.    Dr. Emily Filbert is Medical Director for [place].  Dr. Ottie Glazier is Medical Director for [facility] Pulmonary Rehabilitation.

## 2023-01-01 ENCOUNTER — Encounter: Payer: Managed Care, Other (non HMO) | Admitting: *Deleted

## 2023-01-01 DIAGNOSIS — Z952 Presence of prosthetic heart valve: Secondary | ICD-10-CM | POA: Diagnosis not present

## 2023-01-01 NOTE — Progress Notes (Signed)
Daily Session Note  Patient Details  Name: HUSAIN COSTABILE MRN: [id number] Date of Birth: 26-Jan-1961 Referring Provider:   Flowsheet Row Cardiac Rehab from 10/20/2022 in [facility] Cardiac and Pulmonary Rehab  Referring Provider Paraschos       Encounter Date: 01/01/2023  Check In:  Session Check In - 01/01/23 1712       Check-In   Supervising physician immediately available to respond to emergencies See telemetry face sheet for immediately available ER MD    Location [facility]-Cardiac & Pulmonary Rehab    Staff Present Renita Papa, RN BSN;Joseph Tessie Fass, RCP,RRT,BSRT;Noah [place], [place], Exercise Physiologist    Virtual Visit No    Medication changes reported     No    Fall or balance concerns reported    No    Warm-up and Cool-down Performed on first and last piece of equipment    Resistance Training Performed Yes    VAD Patient? No    PAD/SET Patient? No      Pain Assessment   Currently in Pain? No/denies                Social History   Tobacco Use  Smoking Status Never  Smokeless Tobacco Never    Goals Met:  Independence with exercise equipment Exercise tolerated well No report of concerns or symptoms today Strength training completed today  Goals Unmet:  Not Applicable  Comments: Pt able to follow exercise prescription today without complaint.  Will continue to monitor for progression.    Dr. Emily Filbert is Medical Director for [place].  Dr. Ottie Glazier is Medical Director for [facility] Pulmonary Rehabilitation.

## 2023-01-05 ENCOUNTER — Encounter: Payer: Managed Care, Other (non HMO) | Admitting: *Deleted

## 2023-01-05 DIAGNOSIS — Z952 Presence of prosthetic heart valve: Secondary | ICD-10-CM

## 2023-01-05 NOTE — Progress Notes (Signed)
Daily Session Note  Patient Details  Name: Antonio [name] MRN: [id number] Date of Birth: 06/28/1961 Referring Provider:   Flowsheet Row Cardiac Rehab from 10/20/2022 in [facility] Cardiac and Pulmonary Rehab  Referring Provider Paraschos       Encounter Date: 01/05/2023  Check In:  Session Check In - 01/05/23 1717       Check-In   Supervising physician immediately available to respond to emergencies See telemetry face sheet for immediately available ER MD    Location [facility]-Cardiac & Pulmonary Rehab    Staff Present Renita Papa, RN BSN;Joseph Tessie Fass, RCP,RRT,BSRT;Noah [place], [place], Exercise Physiologist    Virtual Visit No    Medication changes reported     No    Fall or balance concerns reported    No    Warm-up and Cool-down Performed on first and last piece of equipment    Resistance Training Performed Yes    VAD Patient? No    PAD/SET Patient? No      Pain Assessment   Currently in Pain? No/denies                Social History   Tobacco Use  Smoking Status Never  Smokeless Tobacco Never    Goals Met:  Independence with exercise equipment Exercise tolerated well No report of concerns or symptoms today Strength training completed today  Goals Unmet:  Not Applicable  Comments: Pt able to follow exercise prescription today without complaint.  Will continue to monitor for progression.    Dr. Emily Filbert is Medical Director for [place].  Dr. Ottie Glazier is Medical Director for [facility] Pulmonary Rehabilitation.

## 2023-01-07 ENCOUNTER — Encounter: Payer: Managed Care, Other (non HMO) | Admitting: *Deleted

## 2023-01-07 DIAGNOSIS — Z952 Presence of prosthetic heart valve: Secondary | ICD-10-CM | POA: Diagnosis not present

## 2023-01-07 NOTE — Progress Notes (Signed)
Daily Session Note  Patient Details  Name: Antonio [name] MRN: [id number] Date of Birth: 09-Apr-1961 Referring Provider:   Flowsheet Row Cardiac Rehab from 10/20/2022 in [facility] Cardiac and Pulmonary Rehab  Referring Provider Paraschos       Encounter Date: 01/07/2023  Check In:  Session Check In - 01/07/23 1724       Check-In   Supervising physician immediately available to respond to emergencies See telemetry face sheet for immediately available ER MD    Location [facility]-Cardiac & Pulmonary Rehab    Staff Present Renita Papa, RN BSN;Joseph [place], RCP,RRT,BSRT;Kelly [place], [place], ACSM CEP, Exercise Physiologist    Virtual Visit No    Medication changes reported     No    Fall or balance concerns reported    No    Warm-up and Cool-down Performed on first and last piece of equipment    Resistance Training Performed Yes    VAD Patient? No    PAD/SET Patient? No      Pain Assessment   Currently in Pain? No/denies                Social History   Tobacco Use  Smoking Status Never  Smokeless Tobacco Never    Goals Met:  Independence with exercise equipment Exercise tolerated well No report of concerns or symptoms today Strength training completed today  Goals Unmet:  Not Applicable  Comments: Pt able to follow exercise prescription today without complaint.  Will continue to monitor for progression.    Dr. Emily Filbert is Medical Director for [place].  Dr. Ottie Glazier is Medical Director for [facility] Pulmonary Rehabilitation.

## 2023-01-08 ENCOUNTER — Encounter: Payer: Managed Care, Other (non HMO) | Admitting: *Deleted

## 2023-01-08 DIAGNOSIS — Z952 Presence of prosthetic heart valve: Secondary | ICD-10-CM | POA: Diagnosis not present

## 2023-01-08 NOTE — Progress Notes (Signed)
Daily Session Note  Patient Details  Name: Antonio [name] MRN: [id number] Date of Birth: 28-Aug-1961 Referring Provider:   Flowsheet Row Cardiac Rehab from 10/20/2022 in [facility] Cardiac and Pulmonary Rehab  Referring Provider Paraschos       Encounter Date: 01/08/2023  Check In:  Session Check In - 01/08/23 1600       Check-In   Supervising physician immediately available to respond to emergencies See telemetry face sheet for immediately available ER MD    Location [facility]-Cardiac & Pulmonary Rehab    Staff Present Renita Papa, RN BSN;Joseph [place], RCP,RRT,BSRT;Jessica [place], [place], RCEP, CCRP, CCET    Virtual Visit No    Medication changes reported     No    Fall or balance concerns reported    No    Warm-up and Cool-down Performed on first and last piece of equipment    Resistance Training Performed Yes    VAD Patient? No    PAD/SET Patient? No      Pain Assessment   Currently in Pain? No/denies                Social History   Tobacco Use  Smoking Status Never  Smokeless Tobacco Never    Goals Met:  Independence with exercise equipment Exercise tolerated well No report of concerns or symptoms today Strength training completed today  Goals Unmet:  Not Applicable  Comments: Pt able to follow exercise prescription today without complaint.  Will continue to monitor for progression.    Dr. Emily Filbert is Medical Director for [place].  Dr. Ottie Glazier is Medical Director for [facility] Pulmonary Rehabilitation.

## 2023-01-12 ENCOUNTER — Encounter: Payer: Managed Care, Other (non HMO) | Admitting: *Deleted

## 2023-01-12 VITALS — Ht 70.0 in | Wt 176.9 lb

## 2023-01-12 DIAGNOSIS — Z952 Presence of prosthetic heart valve: Secondary | ICD-10-CM

## 2023-01-12 NOTE — Patient Instructions (Signed)
Discharge Patient Instructions  Patient Details  Name: Antonio [name] MRN: [id number] Date of Birth: [birth year] Referring Provider:  Isaias Cowman, MD   Number of Visits: 72  Reason for Discharge:  Patient reached a stable level of exercise. Patient independent in their exercise. Patient has met program and personal goals.  Diagnosis:  S/P AVR (aortic valve replacement)  Initial Exercise Prescription:  Initial Exercise Prescription - 10/20/22 1500       Date of Initial Exercise RX and Referring Provider   Date 10/20/22    Referring Provider Antonio [name]      Oxygen   Maintain Oxygen Saturation 88% or higher      Treadmill   MPH 2.7    Grade 1    Minutes 15    METs 3.44      NuStep   Level 3    SPM 80    Minutes 15    METs 3.85      REL-XR   Level 2    Speed 50    Minutes 15    METs 3.85      Biostep-RELP   Level 2    SPM 50    Minutes 15    METs 3.85      Prescription Details   Frequency (times per week) 3    Duration Progress to 30 minutes of continuous aerobic without signs/symptoms of physical distress      Intensity   THRR 40-80% of Max Heartrate 110-143    Ratings of Perceived Exertion 11-13    Perceived Dyspnea 0-4      Progression   Progression Continue to progress workloads to maintain intensity without signs/symptoms of physical distress.      Resistance Training   Training Prescription Yes    Weight 3    Reps 10-15             Discharge Exercise Prescription (Final Exercise Prescription Changes):  Exercise Prescription Changes - 12/29/22 1500       Response to Exercise   Blood Pressure (Admit) 122/76    Blood Pressure (Exit) 98/60    Heart Rate (Admit) 85 bpm    Heart Rate (Exercise) 139 bpm    Heart Rate (Exit) 86 bpm    Rating of Perceived Exertion (Exercise) 13    Symptoms none    Duration Continue with 30 min of aerobic exercise without signs/symptoms of physical distress.    Intensity THRR unchanged       Progression   Progression Continue to progress workloads to maintain intensity without signs/symptoms of physical distress.    Average METs 6.02      Resistance Training   Training Prescription Yes    Weight 7 lb    Reps 10-15      Interval Training   Interval Training Yes    Equipment Treadmill    Comments incline up to 5%      Treadmill   MPH 3.9    Grade 5    Minutes 15    METs 6.67      NuStep   Level 5    Minutes 15    METs 7.9      REL-XR   Level 7    Minutes 15    METs 5.4             Functional Capacity:  6 Minute Walk     Row Name 10/20/22 1508 01/12/23 1732       6 Minute Walk   Phase  Initial Discharge    Distance 1320 feet 1805 feet    Distance % Change -- 36.7 %    Distance Feet Change -- 485 ft    Walk Time 6 minutes 6 minutes    # of Rest Breaks 0 0    MPH 2.5 3.42    METS 3.85 4.63    RPE 11 12    Perceived Dyspnea  0 0    VO2 Peak 13.49 16.22    Symptoms No No    Resting HR 78 bpm 71 bpm    Resting BP 136/86 118/60    Resting Oxygen Saturation  98 % 99 %    Exercise Oxygen Saturation  during 6 min walk 100 % 96 %    Max Ex. HR 104 bpm 109 bpm    Max Ex. BP 138/70 136/62    2 Minute Post BP 120/80 --            Nutrition & Weight - Outcomes:  Pre Biometrics - 10/20/22 1517       Pre Biometrics   Height 5' 10"$  (1.778 m)    Weight 166 lb 1.6 oz (75.3 kg)    Waist Circumference 32 inches    Hip Circumference 38 inches    Waist to Hip Ratio 0.84 %    BMI (Calculated) 23.83    Single Leg Stand 30 seconds             Post Biometrics - 01/12/23 1736        Post  Biometrics   Height 5' 10"$  (1.778 m)    Weight 176 lb 14.4 oz (80.2 kg)    Waist Circumference 32 inches    Hip Circumference 35.5 inches    Waist to Hip Ratio 0.9 %    BMI (Calculated) 25.38    Single Leg Stand 30 seconds             Nutrition:  Nutrition Therapy & Goals - 10/20/22 1432       Nutrition Therapy   Diet Heart healthy, low Na     Drug/Food Interactions Coumadin/Vit K    Protein (specify units) 60-70g    Fiber 30 grams    Whole Grain Foods 3 servings    Saturated Fats 16 max. grams    Fruits and Vegetables 8 servings/day    Sodium 2 grams      Personal Nutrition Goals   Nutrition Goal ST: re-intoduce vitamin K rich food in consistent amounts with his healthcare team, improve food variety such as eating the rainbow and varying protein, include at least 1 source of healthy fat at most meals like nuts/seeds in salad LT: Follow MyPlate guide for most meals, continue with heart healthy eating pattern, increase variety    Comments 62 y.o. M admitted to cardiac rehab s/p AVR. PMHx includes acute CHF, a.fib, aortic insufficiency, OSA, dyslipidemia, prostate cancer, metabolic syndrome, s/p AVR 2007. Relevant medications includes lipitor, warfarin, trazodone, midodrine, tadalafil. Antonio [name] reports that he has tried dieting before, but kept "yoyo-ing", however he has found an eating pattern he could stick with which is lower in fat and carbohydrates, but he reports including carbohydrates packaged with fiber like smaller amounts of whole grains, fruit, and limited starchy vegetables. Discussed making sure to have a good source of healthy fat at most meals to make sure he can absorb fat soluble nutrients and to include energy and fiber-rich carbohydrates as well.  B: 1 whole egg and 2 egg whites with spinach,  blueberries, and a piece of bacon. Coffee with some splenda. L: chicken (air fry or bake) with a salad D: chicken and non-starchy vegetables. he uses some spray to keep it from sticking and some olive oil as well as spices. He has been limiting salt. Drinks: water or unsweet tea with 1 diet coke per day. Reviewed heart healthy eating and warfarin MNT - Antonio [name] was nervous about his vitamin K intake - discussed new guidelines on vitamin K consistency and provided handouts. Encouraged variety of foods.      Intervention Plan   Intervention  Prescribe, educate and counsel regarding individualized specific dietary modifications aiming towards targeted core components such as weight, hypertension, lipid management, diabetes, heart failure and other comorbidities.;Nutrition handout(s) given to patient.    Expected Outcomes Short Term Goal: Understand basic principles of dietary content, such as calories, fat, sodium, cholesterol and nutrients.;Short Term Goal: A plan has been developed with personal nutrition goals set during dietitian appointment.;Long Term Goal: Adherence to prescribed nutrition plan.

## 2023-01-12 NOTE — Progress Notes (Signed)
Daily Session Note  Patient Details  Name: SHONTA MELI MRN: [id number] Date of Birth: 1961/02/28 Referring Provider:   Flowsheet Row Cardiac Rehab from 10/20/2022 in [facility] Cardiac and Pulmonary Rehab  Referring Provider Paraschos       Encounter Date: 01/12/2023  Check In:  Session Check In - 01/12/23 1714       Check-In   Supervising physician immediately available to respond to emergencies See telemetry face sheet for immediately available ER MD    Location [facility]-Cardiac & Pulmonary Rehab    Staff Present Renita Papa, RN BSN;Noah Tickle, BS, Exercise Physiologist;Joseph Tessie Fass, [place]    Virtual Visit No    Medication changes reported     No    Fall or balance concerns reported    No    Warm-up and Cool-down Performed on first and last piece of equipment    Resistance Training Performed Yes    VAD Patient? No    PAD/SET Patient? No      Pain Assessment   Currently in Pain? No/denies                Social History   Tobacco Use  Smoking Status Never  Smokeless Tobacco Never    Goals Met:  Independence with exercise equipment Exercise tolerated well No report of concerns or symptoms today Strength training completed today  Goals Unmet:  Not Applicable  Comments: Pt able to follow exercise prescription today without complaint.  Will continue to monitor for progression.    Dr. Emily Filbert is Medical Director for [place].  Dr. Ottie Glazier is Medical Director for [facility] Pulmonary Rehabilitation.

## 2023-01-14 ENCOUNTER — Encounter: Payer: Self-pay | Admitting: *Deleted

## 2023-01-14 DIAGNOSIS — Z952 Presence of prosthetic heart valve: Secondary | ICD-10-CM

## 2023-01-14 NOTE — Progress Notes (Signed)
Cardiac Individual Treatment Plan  Patient Details  Name: Antonio [name] MRN: [id number] Date of Birth: [birth year] Referring Provider:   Flowsheet Row Cardiac Rehab from 10/20/2022 in [facility] Cardiac and Pulmonary Rehab  Referring Provider Paraschos       Initial Encounter Date:  Flowsheet Row Cardiac Rehab from 10/20/2022 in [facility] Cardiac and Pulmonary Rehab  Date 10/20/22       Visit Diagnosis: S/P AVR (aortic valve replacement)  Patient's Home Medications on Admission:  Current Outpatient Medications:    acetaminophen (TYLENOL) 500 MG tablet, Take 1,000 mg by mouth every 6 (six) hours as needed (for pain.)., Disp: , Rfl:    aspirin EC 81 MG tablet, Take 81 mg by mouth daily., Disp: , Rfl:    atorvastatin (LIPITOR) 10 MG tablet, Take 10 mg by mouth every evening. , Disp: , Rfl:    cetirizine (ZYRTEC) 10 MG tablet, Take 10 mg by mouth daily., Disp: , Rfl:    loratadine (CLARITIN) 10 MG tablet, Take by mouth., Disp: , Rfl:    midodrine (PROAMATINE) 10 MG tablet, Take 1 tablet (10 mg total) by mouth 3 (three) times daily with meals. (Patient not taking: Reported on 12/03/2022), Disp: , Rfl:    Polyethyl Glycol-Propyl Glycol (LUBRICANT EYE DROPS) 0.4-0.3 % SOLN, Place 1 drop into both eyes 3 (three) times daily as needed (dry/irritated eyes.)., Disp: , Rfl:    tadalafil (CIALIS) 5 MG tablet, Take 1-3 tablets (5-15 mg total) by mouth daily as needed for erectile dysfunction., Disp: 30 tablet, Rfl: 11   tamsulosin (FLOMAX) 0.4 MG CAPS capsule, TAKE 1 CAPSULE(0.4 MG) BY MOUTH DAILY AFTER SUPPER (Patient not taking: Reported on 12/03/2022), Disp: 90 capsule, Rfl: 3   traZODone (DESYREL) 50 MG tablet, Take 0.5 tablets (25 mg total) by mouth at bedtime as needed for sleep., Disp: , Rfl:    warfarin (COUMADIN) 2 MG tablet, Take 2 mg by mouth., Disp: , Rfl:   Past Medical History: Past Medical History:  Diagnosis Date   Aortic aneurysm ([place])    Aortic insufficiency 07/12/2014   Overview:  Mild to  moderate   H/O bicuspid aortic valve 11/04/2013   Heart palpitations 05/15/2017   Hypertension    Metabolic syndrome 99991111   OSA (obstructive sleep apnea) 06/11/2018   no CPAP/None suggested   Premature ventricular contractions 05/28/2017   S/P aortic valve replacement with bioprosthetic valve 11/04/2013   Overview:  With carpentier-edwards bovine pericardial valve and aortic aneurysm repair 03/26/06 by Dr Ysidro Evert at [facility]    Tobacco Use: Social History   Tobacco Use  Smoking Status Never  Smokeless Tobacco Never    Labs: Review Flowsheet        No data to display           Exercise Target Goals: Exercise Program Goal: Individual exercise prescription set using results from initial 6 min walk test and THRR while considering  patient's activity barriers and safety.   Exercise Prescription Goal: Initial exercise prescription builds to 30-45 minutes a day of aerobic activity, 2-3 days per week.  Home exercise guidelines will be given to patient during program as part of exercise prescription that the participant will acknowledge.   Education: Aerobic Exercise: - Group verbal and visual presentation on the components of exercise prescription. Introduces F.I.T.T principle from ACSM for exercise prescriptions.  Reviews F.I.T.T. principles of aerobic exercise including progression. Written material given at graduation.   Education: Resistance Exercise: - Group verbal and visual presentation on the components of  exercise prescription. Introduces F.I.T.T principle from ACSM for exercise prescriptions  Reviews F.I.T.T. principles of resistance exercise including progression. Written material given at graduation.    Education: Exercise & Equipment Safety: - Individual verbal instruction and demonstration of equipment use and safety with use of the equipment. Flowsheet Row Cardiac Rehab from 11/12/2022 in [facility] Cardiac and Pulmonary Rehab  Date 10/20/22  Educator [facility]  Instruction  Review Code 1- Verbalizes Understanding       Education: Exercise Physiology & General Exercise Guidelines: - Group verbal and written instruction with models to review the exercise physiology of the cardiovascular system and associated critical values. Provides general exercise guidelines with specific guidelines to those with heart or lung disease.    Education: Flexibility, Balance, Mind/Body Relaxation: - Group verbal and visual presentation with interactive activity on the components of exercise prescription. Introduces F.I.T.T principle from ACSM for exercise prescriptions. Reviews F.I.T.T. principles of flexibility and balance exercise training including progression. Also discusses the mind body connection.  Reviews various relaxation techniques to help reduce and manage stress (i.e. Deep breathing, progressive muscle relaxation, and visualization). Balance handout provided to take home. Written material given at graduation.   Activity Barriers & Risk Stratification:  Activity Barriers & Cardiac Risk Stratification - 10/20/22 1509       Activity Barriers & Cardiac Risk Stratification   Activity Barriers None    Cardiac Risk Stratification Moderate             6 Minute Walk:  6 Minute Walk     Row Name 10/20/22 1508 01/12/23 1732       6 Minute Walk   Phase Initial Discharge    Distance 1320 feet 1805 feet    Distance % Change -- 36.7 %    Distance Feet Change -- 485 ft    Walk Time 6 minutes 6 minutes    # of Rest Breaks 0 0    MPH 2.5 3.42    METS 3.85 4.63    RPE 11 12    Perceived Dyspnea  0 0    VO2 Peak 13.49 16.22    Symptoms No No    Resting HR 78 bpm 71 bpm    Resting BP 136/86 118/60    Resting Oxygen Saturation  98 % 99 %    Exercise Oxygen Saturation  during 6 min walk 100 % 96 %    Max Ex. HR 104 bpm 109 bpm    Max Ex. BP 138/70 136/62    2 Minute Post BP 120/80 --             Oxygen Initial Assessment:   Oxygen  Re-Evaluation:   Oxygen Discharge (Final Oxygen Re-Evaluation):   Initial Exercise Prescription:  Initial Exercise Prescription - 10/20/22 1500       Date of Initial Exercise RX and Referring Provider   Date 10/20/22    Referring Provider Paraschos      Oxygen   Maintain Oxygen Saturation 88% or higher      Treadmill   MPH 2.7    Grade 1    Minutes 15    METs 3.44      NuStep   Level 3    SPM 80    Minutes 15    METs 3.85      REL-XR   Level 2    Speed 50    Minutes 15    METs 3.85      Biostep-RELP   Level 2  SPM 50    Minutes 15    METs 3.85      Prescription Details   Frequency (times per week) 3    Duration Progress to 30 minutes of continuous aerobic without signs/symptoms of physical distress      Intensity   THRR 40-80% of Max Heartrate 110-143    Ratings of Perceived Exertion 11-13    Perceived Dyspnea 0-4      Progression   Progression Continue to progress workloads to maintain intensity without signs/symptoms of physical distress.      Resistance Training   Training Prescription Yes    Weight 3    Reps 10-15             Perform Capillary Blood Glucose checks as needed.  Exercise Prescription Changes:   Exercise Prescription Changes     Row Name 10/20/22 1500 11/03/22 1500 11/18/22 1500 12/01/22 1300 12/16/22 1300     Response to Exercise   Blood Pressure (Admit) 136/86 136/68 124/76 118/74 122/60   Blood Pressure (Exercise) 138/70 134/66 140/82 140/74 138/66   Blood Pressure (Exit) 120/80 126/68 108/62 102/60 102/64   Heart Rate (Admit) 78 bpm 81 bpm 64 bpm 75 bpm 71 bpm   Heart Rate (Exercise) 104 bpm 127 bpm 141 bpm 146 bpm 134 bpm   Heart Rate (Exit) 83 bpm 99 bpm 92 bpm 88 bpm 87 bpm   Oxygen Saturation (Admit) 98 % -- -- -- --   Oxygen Saturation (Exercise) 100 % -- -- -- --   Oxygen Saturation (Exit) 100 % -- -- -- --   Rating of Perceived Exertion (Exercise) 11 11 13 13 13   $ Perceived Dyspnea (Exercise) 0 -- -- --  --   Symptoms none none none none none   Comments 6 MWT results 2nd full day of exercise -- -- --   Duration -- Continue with 30 min of aerobic exercise without signs/symptoms of physical distress. Continue with 30 min of aerobic exercise without signs/symptoms of physical distress. Continue with 30 min of aerobic exercise without signs/symptoms of physical distress. Continue with 30 min of aerobic exercise without signs/symptoms of physical distress.   Intensity -- THRR unchanged THRR unchanged THRR unchanged THRR unchanged     Progression   Progression -- Continue to progress workloads to maintain intensity without signs/symptoms of physical distress. Continue to progress workloads to maintain intensity without signs/symptoms of physical distress. Continue to progress workloads to maintain intensity without signs/symptoms of physical distress. Continue to progress workloads to maintain intensity without signs/symptoms of physical distress.   Average METs -- 3.73 4.38 5.39 5.32     Resistance Training   Training Prescription -- Yes Yes Yes Yes   Weight -- 3 lb 3 lb 7 lb 7 lb   Reps -- 10-15 10-15 10-15 10-15     Interval Training   Interval Training -- No No Yes Yes   Equipment -- -- -- Treadmill Treadmill   Comments -- -- -- incline up to 5 % incline up to 5 %     Treadmill   MPH -- 3 3.5 3.5  intervals 3.7  intervals   Grade -- 1 2 5 5   $ Minutes -- 15 15 15 15   $ METs -- 3.71 4.65 -- 6.09     NuStep   Level -- -- -- 4 4   Minutes -- -- -- 15 15   METs -- -- -- 5.8 5     Elliptical   Level -- --  2 -- --   Speed -- -- 4.5 -- --   Minutes -- -- 15 -- --     REL-XR   Level -- 3 5 7 7   $ Minutes -- 15 15 15 15   $ METs -- 3.8 -- 6.1 --     Biostep-RELP   Level -- 2 3 -- --   Minutes -- 15 15 -- --   METs -- 4 4 -- --     Oxygen   Maintain Oxygen Saturation -- 88% or higher 88% or higher 88% or higher 88% or higher    Row Name 12/24/22 1700 12/29/22 1500 01/13/23 1500          Response to Exercise   Blood Pressure (Admit) -- 122/76 104/60     Blood Pressure (Exit) -- 98/60 98/62     Heart Rate (Admit) -- 85 bpm 71 bpm     Heart Rate (Exercise) -- 139 bpm 141 bpm     Heart Rate (Exit) -- 86 bpm 80 bpm     Rating of Perceived Exertion (Exercise) -- 13 13     Symptoms -- none none     Duration -- Continue with 30 min of aerobic exercise without signs/symptoms of physical distress. Continue with 30 min of aerobic exercise without signs/symptoms of physical distress.     Intensity -- THRR unchanged THRR unchanged       Progression   Progression -- Continue to progress workloads to maintain intensity without signs/symptoms of physical distress. Continue to progress workloads to maintain intensity without signs/symptoms of physical distress.     Average METs -- 6.02 6.26       Resistance Training   Training Prescription -- Yes Yes     Weight -- 7 lb 7 lb     Reps -- 10-15 10-15       Interval Training   Interval Training -- Yes Yes     Equipment -- Treadmill Treadmill     Comments -- incline up to 5% incline up to 5%       Treadmill   MPH -- 3.9 3.9     Grade -- 5 5     Minutes -- 15 15     METs -- 6.67 6.67       NuStep   Level -- 5 5     Minutes -- 15 15     METs -- 7.9 6.8       Elliptical   Level -- -- 3     Speed -- -- 4.5     Minutes -- -- 15       REL-XR   Level -- 7 7     Minutes -- 15 15     METs -- 5.4 6       Home Exercise Plan   Plans to continue exercise at Home (comment)  walking at treadmill and outdoors -- Home (comment)  walking at treadmill and outdoors     Frequency Add 2 additional days to program exercise sessions. -- Add 2 additional days to program exercise sessions.     Initial Home Exercises Provided 12/24/22 -- 12/24/22       Oxygen   Maintain Oxygen Saturation 88% or higher -- 88% or higher              Exercise Comments:   Exercise Comments     Row Name 10/22/22 1646           Exercise Comments  First full day of exercise!  Patient was oriented to gym and equipment including functions, settings, policies, and procedures.  Patient's individual exercise prescription and treatment plan were reviewed.  All starting workloads were established based on the results of the 6 minute walk test done at initial orientation visit.  The plan for exercise progression was also introduced and progression will be customized based on patient's performance and goals.                Exercise Goals and Review:   Exercise Goals     Row Name 10/20/22 1516             Exercise Goals   Increase Physical Activity Yes       Intervention Provide advice, education, support and counseling about physical activity/exercise needs.;Develop an individualized exercise prescription for aerobic and resistive training based on initial evaluation findings, risk stratification, comorbidities and participant's personal goals.       Expected Outcomes Short Term: Attend rehab on a regular basis to increase amount of physical activity.;Long Term: Add in home exercise to make exercise part of routine and to increase amount of physical activity.;Long Term: Exercising regularly at least 3-5 days a week.       Increase Strength and Stamina Yes       Intervention Provide advice, education, support and counseling about physical activity/exercise needs.;Develop an individualized exercise prescription for aerobic and resistive training based on initial evaluation findings, risk stratification, comorbidities and participant's personal goals.       Expected Outcomes Short Term: Increase workloads from initial exercise prescription for resistance, speed, and METs.;Short Term: Perform resistance training exercises routinely during rehab and add in resistance training at home;Long Term: Improve cardiorespiratory fitness, muscular endurance and strength as measured by increased METs and functional capacity (6MWT)       Able to understand and  use rate of perceived exertion (RPE) scale Yes       Intervention Provide education and explanation on how to use RPE scale       Expected Outcomes Short Term: Able to use RPE daily in rehab to express subjective intensity level;Long Term:  Able to use RPE to guide intensity level when exercising independently       Able to understand and use Dyspnea scale Yes       Intervention Provide education and explanation on how to use Dyspnea scale       Expected Outcomes Short Term: Able to use Dyspnea scale daily in rehab to express subjective sense of shortness of breath during exertion;Long Term: Able to use Dyspnea scale to guide intensity level when exercising independently       Knowledge and understanding of Target Heart Rate Range (THRR) Yes       Intervention Provide education and explanation of THRR including how the numbers were predicted and where they are located for reference       Expected Outcomes Short Term: Able to state/look up THRR;Long Term: Able to use THRR to govern intensity when exercising independently;Short Term: Able to use daily as guideline for intensity in rehab       Able to check pulse independently Yes       Intervention Provide education and demonstration on how to check pulse in carotid and radial arteries.;Review the importance of being able to check your own pulse for safety during independent exercise       Expected Outcomes Short Term: Able to explain why pulse checking is important during independent  exercise       Understanding of Exercise Prescription Yes       Intervention Provide education, explanation, and written materials on patient's individual exercise prescription       Expected Outcomes Short Term: Able to explain program exercise prescription;Long Term: Able to explain home exercise prescription to exercise independently                Exercise Goals Re-Evaluation :  Exercise Goals Re-Evaluation     Row Name 10/22/22 1646 11/03/22 1554 11/18/22  1510 12/01/22 1348 12/11/22 1720     Exercise Goal Re-Evaluation   Exercise Goals Review Increase Physical Activity;Able to understand and use rate of perceived exertion (RPE) scale;Knowledge and understanding of Target Heart Rate Range (THRR);Understanding of Exercise Prescription;Increase Strength and Stamina;Able to check pulse independently Increase Physical Activity;Increase Strength and Stamina;Understanding of Exercise Prescription Increase Physical Activity;Increase Strength and Stamina;Understanding of Exercise Prescription Increase Physical Activity;Increase Strength and Stamina;Understanding of Exercise Prescription Increase Physical Activity;Increase Strength and Stamina;Understanding of Exercise Prescription   Comments Reviewed RPE scale, THR and program prescription with pt today.  Pt voiced understanding and was given a copy of goals to take home. Estle is off to a good start with rehab for the first couple of sessions he has been here. He increased to 3 mph/ 1% incline on the treadmill and RPEs were on the lighter end. He hit his THR both sessions thus far. We will continue to monitor as he progresses in the program. Zeid continues to do well in rehab. He recently increased his overall average MET level to 4.38 METs. He also improved to level 5 on the XR and level 3 on the biostep. he increased his workload on the treadmill as well, to a speed of 3.5 mph and an incline of 2%. We will continue to monitor his progress in the program. Amol continues to do well in rehab. He started doing intervals on the treadmill, going up to 5% incline with a steady state speed of 3.5 mph. He also increased to level 7 on the elliptical and is now using 7 lbs for handweights. He is most definitely hitting his THR each time. We will continue to monitor as he progresses in the program. Davone states that he is doing well in rehab. He states he feels he could do more but his restrictions are keeping him from doing so. He  also states that he has increased his strength and stamina. He also reports feeling better mentally since starting the program. Keenen states that he has been walking 3-4 miles at least 3 times a week on his days away from rehab.   Expected Outcomes Short: Use RPE daily to regulate intensity.  Long: Follow program prescription in THR. Short: Follow exercise prescription, increase workload on seated machines slowly Long: Increase overall MET level Short: Continue to increase workloads. Long: Continue to improve strength and stamina. Short: Continue to work on intervals with treadmill, add to XR Long: Continue to increase overall MET level Short: Continue to walk on days away from rehab. Long: Continue to improve strength and stamina.    Elizabeth Name 12/16/22 1351 12/24/22 1736 12/29/22 1523 01/13/23 1527       Exercise Goal Re-Evaluation   Exercise Goals Review Increase Physical Activity;Increase Strength and Stamina;Understanding of Exercise Prescription Increase Physical Activity;Increase Strength and Stamina;Understanding of Exercise Prescription Increase Physical Activity;Increase Strength and Stamina;Understanding of Exercise Prescription Increase Physical Activity;Increase Strength and Stamina;Understanding of Exercise Prescription    Comments Chadwick  continues to do well in rehab. He has consistently worked at an average MET level above 5 METs. He also has continued to do intervals of incline on the treadmill from 3-5% at an increased speed of 3.7 mph. He is doing well with 7 lb hand weights for resistance training as well. We will continue to monitor his progress. Reviewed home exercise with pt today.  Pt plans to walk on his treadmill at home and outdoors for exercise.  Reviewed THR, pulse, RPE, sign and symptoms, pulse oximetery and when to call 911 or MD.  Also discussed weather considerations and indoor options.  Pt voiced understanding. He does check his HR with his fitness watch routinely. Reviewed [place].  He does want to add a little more weight training at home as he has his own hand weights to use. Coltan continues to do well in rehab. He continues to work on intervals on the treadmill up to 5%, in addition to that, he increased his speed on the treadmill to 3.9 mph. He also increased his workload on the T4 Nustep to level 5. He continues to reach his THR each session with appropriate RPEs. Will continue to monitor. Bentlie continues to do well in rehab. He recently increased his overall average MET level to 6.26 METs. He improved to level 3 on the elliptical as well, at a speed of 4.5 mph. He also completed his post 6MWT and improved by 36.7%. We will continue to monitor his progress in the program.    Expected Outcomes Short: Continue interval training on the treadmill Long: Continue to improve strength and stamina. Short: Continue monitoring HR during exericse Long: Continue to exercise at home Short:Continue intervals on treadmill Long: Continue to increase overall MET level and stamina Short: Continue to increase workload on the elliptical. Long: Continue to improve strength and stamina.             Discharge Exercise Prescription (Final Exercise Prescription Changes):  Exercise Prescription Changes - 01/13/23 1500       Response to Exercise   Blood Pressure (Admit) 104/60    Blood Pressure (Exit) 98/62    Heart Rate (Admit) 71 bpm    Heart Rate (Exercise) 141 bpm    Heart Rate (Exit) 80 bpm    Rating of Perceived Exertion (Exercise) 13    Symptoms none    Duration Continue with 30 min of aerobic exercise without signs/symptoms of physical distress.    Intensity THRR unchanged      Progression   Progression Continue to progress workloads to maintain intensity without signs/symptoms of physical distress.    Average METs 6.26      Resistance Training   Training Prescription Yes    Weight 7 lb    Reps 10-15      Interval Training   Interval Training Yes    Equipment Treadmill     Comments incline up to 5%      Treadmill   MPH 3.9    Grade 5    Minutes 15    METs 6.67      NuStep   Level 5    Minutes 15    METs 6.8      Elliptical   Level 3    Speed 4.5    Minutes 15      REL-XR   Level 7    Minutes 15    METs 6      Home Exercise Plan   Plans to continue exercise at  Home (comment)   walking at treadmill and outdoors   Frequency Add 2 additional days to program exercise sessions.    Initial Home Exercises Provided 12/24/22      Oxygen   Maintain Oxygen Saturation 88% or higher             Nutrition:  Target Goals: Understanding of nutrition guidelines, daily intake of sodium <154m, cholesterol <209m calories 30% from fat and 7% or less from saturated fats, daily to have 5 or more servings of fruits and vegetables.  Education: All About Nutrition: -Group instruction provided by verbal, written material, interactive activities, discussions, models, and posters to present general guidelines for heart healthy nutrition including fat, fiber, MyPlate, the role of sodium in heart healthy nutrition, utilization of the nutrition label, and utilization of this knowledge for meal planning. Follow up email sent as well. Written material given at graduation. Flowsheet [facility]  Date 10/22/22  [facility] Review Code 1- Verbalizes Understanding       Biometrics:  Pre Biometrics - 10/20/22 1517       Pre Biometrics   Height 5' 10"$  (1.778 m)    Weight 166 lb 1.6 oz (75.3 kg)    Waist Circumference 32 inches    Hip Circumference 38 inches    Waist to Hip Ratio 0.84 %    BMI (Calculated) 23.83    Single Leg Stand 30 seconds             Post Biometrics - 01/12/23 1736        Post  Biometrics   Height 5' 10"$  (1.778 m)    Weight 176 lb 14.4 oz (80.2 kg)    Waist Circumference 32 inches    Hip Circumference 35.5 inches    Waist to Hip Ratio 0.9 %    BMI  (Calculated) 25.38    Single Leg Stand 30 seconds             Nutrition Therapy Plan and Nutrition Goals:  Nutrition Therapy & Goals - 10/20/22 1432       Nutrition Therapy   Diet Heart healthy, low Na    Drug/Food Interactions Coumadin/Vit K    Protein (specify units) 60-70g    Fiber 30 grams    Whole Grain Foods 3 servings    Saturated Fats 16 max. grams    Fruits and Vegetables 8 servings/day    Sodium 2 grams      Personal Nutrition Goals   Nutrition Goal ST: re-intoduce vitamin K rich food in consistent amounts with his healthcare team, improve food variety such as eating the rainbow and varying protein, include at least 1 source of healthy fat at most meals like nuts/seeds in salad LT: Follow MyPlate guide for most meals, continue with heart healthy eating pattern, increase variety    Comments [age].[id number]. M admitted to cardiac rehab s/p AVR. PMHx includes acute CHF, a.fib, aortic insufficiency, OSA, dyslipidemia, prostate cancer, metabolic syndrome, s/p AVR 2007. Relevant medications includes lipitor, warfarin, trazodone, midodrine, tadalafil. LyStefaneports that he has tried dieting before, but kept "yoyo-ing", however he has found an eating pattern he could stick with which is lower in fat and carbohydrates, but he reports including carbohydrates packaged with fiber like smaller amounts of whole grains, fruit, and limited starchy vegetables. Discussed making sure to have a good source of healthy fat at most meals to make sure he can absorb fat soluble nutrients  and to include energy and fiber-rich carbohydrates as well.  B: 1 whole egg and 2 egg whites with spinach, blueberries, and a piece of bacon. Coffee with some splenda. L: chicken (air fry or bake) with a salad D: chicken and non-starchy vegetables. he uses some spray to keep it from sticking and some olive oil as well as spices. He has been limiting salt. Drinks: water or unsweet tea with 1 diet coke per day. Reviewed heart healthy  eating and warfarin MNT - Vadhir was nervous about his vitamin K intake - discussed new guidelines on vitamin K consistency and provided handouts. Encouraged variety of foods.      Intervention Plan   Intervention Prescribe, educate and counsel regarding individualized specific dietary modifications aiming towards targeted core components such as weight, hypertension, lipid management, diabetes, heart failure and other comorbidities.;Nutrition handout(s) given to patient.    Expected Outcomes Short Term Goal: Understand basic principles of dietary content, such as calories, fat, sodium, cholesterol and nutrients.;Short Term Goal: A plan has been developed with personal nutrition goals set during dietitian appointment.;Long Term Goal: Adherence to prescribed nutrition plan.             Nutrition Assessments:  MEDIFICTS Score Key: ?70 Need to make dietary changes  40-70 Heart Healthy Diet ? 40 Therapeutic Level Cholesterol Diet  Flowsheet Row Cardiac Rehab from 10/20/2022 in [facility] Cardiac and Pulmonary Rehab  Picture Your Plate Total Score on Admission 75      Picture Your Plate Scores: [id number] Unhealthy dietary pattern with much room for improvement. 41-50 Dietary pattern unlikely to meet recommendations for good health and room for improvement. 51-60 More healthful dietary pattern, with some room for improvement.  >60 Healthy dietary pattern, although there may be some specific behaviors that could be improved.    Nutrition Goals Re-Evaluation:  Nutrition Goals Re-Evaluation     [place] Name 11/03/22 1736 12/11/22 1727 12/24/22 1733         Goals   Current Weight 168 lb (76.2 kg) -- --     Nutrition Goal No questions at this time. Demarco states he is happy with where his diet is at right now. He states that he did have excess sugar with all the sweets over the holidays. However, he plans on doing better at cutting out sweets over the next month. ST: re-intoduce vitamin K rich food in  consistent amounts with his healthcare team, improve food variety such as eating the rainbow and varying protein, include at least 1 source of healthy fat at most meals like nuts/seeds in salad LT: Follow MyPlate guide for most meals, continue with heart healthy eating pattern, increase variety     Comment Patient states that he is eating well and has no questions on his diet. Short: Cut down on sweet snacks. Long: Continue heart-healthy eating patterns. Siyuan has been eating avocado and salkon for healthy fats.  He eats egg whites for breakfast, grilled chicken, black beans for protein sources. Once a week for red meat. He is staying away from sugars. He has to refrain from leafy greens because of his INR check.  He nornally eats brussel sprouts, asparagus, carrots, sweet potato, green beans, broccoli for vegetables. He denies other concerns with his diet at this time.     Expected Outcome -- -- Short: Continue focusing on protein intake, variety of fruits/vegetables Long: Continue to eat heart healthy diet              Nutrition Goals Discharge (  Final Nutrition Goals Re-Evaluation):  Nutrition Goals Re-Evaluation - 12/24/22 1733       Goals   Nutrition Goal ST: re-intoduce vitamin K rich food in consistent amounts with his healthcare team, improve food variety such as eating the rainbow and varying protein, include at least 1 source of healthy fat at most meals like nuts/seeds in salad LT: Follow MyPlate guide for most meals, continue with heart healthy eating pattern, increase variety    Comment Jadell has been eating avocado and salkon for healthy fats.  He eats egg whites for breakfast, grilled chicken, black beans for protein sources. Once a week for red meat. He is staying away from sugars. He has to refrain from leafy greens because of his INR check.  He nornally eats brussel sprouts, asparagus, carrots, sweet potato, green beans, broccoli for vegetables. He denies other concerns with his diet  at this time.    Expected Outcome Short: Continue focusing on protein intake, variety of fruits/vegetables Long: Continue to eat heart healthy diet             Psychosocial: Target Goals: Acknowledge presence or absence of significant depression and/or stress, maximize coping skills, provide positive support system. Participant is able to verbalize types and ability to use techniques and skills needed for reducing stress and depression.   Education: Stress, Anxiety, and Depression - Group verbal and visual presentation to define topics covered.  Reviews how body is impacted by stress, anxiety, and depression.  Also discusses healthy ways to reduce stress and to treat/manage anxiety and depression.  Written material given at graduation.   Education: Sleep Hygiene -Provides group verbal and written instruction about how sleep can affect your health.  Define sleep hygiene, discuss sleep cycles and impact of sleep habits. Review good sleep hygiene tips.    Initial Review & Psychosocial Screening:  Initial Psych Review & Screening - 10/20/22 1010       Initial Review   Current issues with None Identified      Family Dynamics   Good Support System? Yes   wife, children, family     Barriers   Psychosocial barriers to participate in program There are no identifiable barriers or psychosocial needs.;The patient should benefit from training in stress management and relaxation.      Screening Interventions   Interventions Encouraged to exercise;Provide feedback about the scores to participant;To provide support and resources with identified psychosocial needs    Expected Outcomes Short Term goal: Utilizing psychosocial counselor, staff and physician to assist with identification of specific Stressors or current issues interfering with healing process. Setting desired goal for each stressor or current issue identified.;Long Term Goal: Stressors or current issues are controlled or  eliminated.;Short Term goal: Identification and review with participant of any Quality of Life or Depression concerns found by scoring the questionnaire.;Long Term goal: The participant improves quality of Life and PHQ9 Scores as seen by post scores and/or verbalization of changes             Quality of Life Scores:   Quality of Life - 10/20/22 1526       Quality of Life   Select Quality of Life      Quality of Life Scores   Health/Function Pre 26.67 %    Socioeconomic Pre 27.86 %    Psych/Spiritual Pre 29.29 %    Family Pre 27.5 %    GLOBAL Pre 27.57 %            Scores of  19 and below usually indicate a poorer quality of life in these areas.  A difference of  2-3 points is a clinically meaningful difference.  A difference of 2-3 points in the total score of the Quality of Life Index has been associated with significant improvement in overall quality of life, self-image, physical symptoms, and general health in studies assessing change in quality of life.  PHQ-9: Review Flowsheet       10/20/2022  Depression screen PHQ 2/9  Decreased Interest 0  Down, Depressed, Hopeless 0  PHQ - 2 Score 0  Altered sleeping 0  Tired, decreased energy 0  Change in appetite 0  Feeling bad or failure about yourself  0  Trouble concentrating 0  Moving slowly or fidgety/restless 0  Suicidal thoughts 0  PHQ-9 Score 0  Difficult doing work/chores Not difficult at all   Interpretation of Total Score  Total Score Depression Severity:  1-4 = Minimal depression, 5-9 = Mild depression, 10-14 = Moderate depression, 15-19 = Moderately severe depression, 20-27 = Severe depression   Psychosocial Evaluation and Intervention:  Psychosocial Evaluation - 10/20/22 1020       Psychosocial Evaluation & Interventions   Interventions Encouraged to exercise with the program and follow exercise prescription    Comments 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 He had one done in 2007 so he is  familiar with the program and process. He reports no stress concerns or sleep issues. He has a very supporting family and friend circle. He states recovery has been smooth so far. He is quite active and wants to get back to exercising like he previously was.    Expected Outcomes Short: attend cardiac rehab for education and exercise. Long: develop and maintian positive self care habits.    Continue Psychosocial Services  Follow up required by staff             Psychosocial Re-Evaluation:  Psychosocial Re-Evaluation     [place] Name 11/03/22 1737 12/11/22 1725 12/24/22 1739         Psychosocial Re-Evaluation   Current issues with None Identified None Identified None Identified     Comments Patient reports no issues with their current mental states, sleep, stress, depression or anxiety. Will follow up with patient in a few weeks for any changes. Kenaan states that he is doing well mentally and has no current stressors. He says that he is happy with his job and states that He "is very blessed." He reports sleeping well also. He feels that exercise has really helped his mental state as well. Maseo has no complaints so far and says "life is good." He is awaiting to hear from his EP next week on his next steps for his heart as he completed his cardioversion but it didn't take. He has good support at home, no sleep issues,  not on any medications for emotional support. He loves to exercise as his stress relief which he does almost everyday. He is enjoying coming to rehab and denies other conerns at this time.     Expected Outcomes Short: Continue to exercise regularly to support mental health and notify staff of any changes. Long: maintain mental health and well being through teaching of rehab or prescribed medications independently. Short: Continue to exercise regularly to support mental health and notify staff of any changes. Long: maintain mental health and well being through teaching of rehab or prescribed  medications independently. ShorT: Continue to come to rehab for mood boost Long:  Continue to maintain positive attitude     Interventions Encouraged to attend Cardiac Rehabilitation for the exercise Encouraged to attend Cardiac Rehabilitation for the exercise Encouraged to attend Cardiac Rehabilitation for the exercise     Continue Psychosocial Services  Follow up required by staff Follow up required by staff Follow up required by staff              Psychosocial Discharge (Final Psychosocial Re-Evaluation):  Psychosocial Re-Evaluation - 12/24/22 1739       Psychosocial Re-Evaluation   Current issues with None Identified    Comments Charly has no complaints so far and says "life is good." He is awaiting to hear from his EP next week on his next steps for his heart as he completed his cardioversion but it didn't take. He has good support at home, no sleep issues,  not on any medications for emotional support. He loves to exercise as his stress relief which he does almost everyday. He is enjoying coming to rehab and denies other conerns at this time.    Expected Outcomes ShorT: Continue to come to rehab for mood boost Long: Continue to maintain positive attitude    Interventions Encouraged to attend Cardiac Rehabilitation for the exercise    Continue Psychosocial Services  Follow up required by staff             Vocational Rehabilitation: Provide vocational rehab assistance to qualifying candidates.   Vocational Rehab Evaluation & Intervention:  Vocational Rehab - 10/20/22 1010       Initial Vocational Rehab Evaluation & Intervention   Assessment shows need for Vocational Rehabilitation No             Education: Education Goals: Education classes will be provided on a variety of topics geared toward better understanding of heart health and risk factor modification. Participant will state understanding/return demonstration of topics presented as noted by education test  scores.  Learning Barriers/Preferences:  Learning Barriers/Preferences - 10/20/22 1020       Learning Barriers/Preferences   Learning Barriers None    Learning Preferences None             General Cardiac Education Topics:  AED/CPR: - Group verbal and written instruction with the use of models to demonstrate the basic use of the AED with the basic ABC's of resuscitation.   Anatomy and Cardiac Procedures: - Group verbal and visual presentation and models provide information about basic cardiac anatomy and function. Reviews the testing methods done to diagnose heart disease and the outcomes of the test results. Describes the treatment choices: Medical Management, Angioplasty, or Coronary Bypass Surgery for treating various heart conditions including Myocardial Infarction, Angina, Valve Disease, and Cardiac Arrhythmias.  Written material given at graduation. Flowsheet Row Cardiac Rehab from 11/12/2022 in [facility] Cardiac and Pulmonary Rehab  Education need identified 10/20/22       Medication Safety: - Group verbal and visual instruction to review commonly prescribed medications for heart and lung disease. Reviews the medication, class of the drug, and side effects. Includes the steps to properly store meds and maintain the prescription regimen.  Written material given at graduation.   Intimacy: - Group verbal instruction through game format to discuss how heart and lung disease can affect sexual intimacy. Written material given at graduation..   Know Your Numbers and Heart Failure: - Group verbal and visual instruction to discuss disease risk factors for cardiac and pulmonary disease and treatment options.  Reviews associated critical values for Overweight/Obesity, Hypertension,  Cholesterol, and Diabetes.  Discusses basics of heart failure: signs/symptoms and treatments.  Introduces Heart Failure Zone chart for action plan for heart failure.  Written material given at  graduation. Flowsheet Row Cardiac Rehab from 11/12/2022 in [facility] Cardiac and Pulmonary Rehab  Date 11/12/22  Educator SB  Instruction Review Code 1- Verbalizes Understanding       Infection Prevention: - Provides verbal and written material to individual with discussion of infection control including proper hand washing and proper equipment cleaning during exercise session. Flowsheet Row Cardiac Rehab from 11/12/2022 in [facility] Cardiac and Pulmonary Rehab  Date 10/20/22  Educator [facility]  Instruction Review Code 1- Verbalizes Understanding       Falls Prevention: - Provides verbal and written material to individual with discussion of falls prevention and safety. Flowsheet Row Cardiac Rehab from 11/12/2022 in [facility] Cardiac and Pulmonary Rehab  Date 10/20/22  Educator [facility]  Instruction Review Code 1- Verbalizes Understanding       Other: -Provides group and verbal instruction on various topics (see comments)   Knowledge Questionnaire Score:  Knowledge Questionnaire Score - 10/20/22 1526       Knowledge Questionnaire Score   Pre Score 25/26             Core Components/Risk Factors/Patient Goals at Admission:  Personal Goals and Risk Factors at Admission - 10/20/22 1527       Core Components/Risk Factors/Patient Goals on Admission    Weight Management Yes    Intervention Weight Management: Develop a combined nutrition and exercise program designed to reach desired caloric intake, while maintaining appropriate intake of nutrient and fiber, sodium and fats, and appropriate energy expenditure required for the weight goal.;Weight Management: Provide education and appropriate resources to help participant work on and attain dietary goals.    Admit Weight 166 lb 1.6 oz (75.3 kg)    Goal Weight: Short Term 166 lb (75.3 kg)    Goal Weight: Long Term 166 lb (75.3 kg)    Expected Outcomes Short Term: Continue to assess and modify interventions until short term weight is achieved;Long  Term: Adherence to nutrition and physical activity/exercise program aimed toward attainment of established weight goal;Weight Maintenance: Understanding of the daily nutrition guidelines, which includes 25-35% calories from fat, 7% or less cal from saturated fats, less than 277m cholesterol, less than 1.5gm of sodium, & 5 or more servings of fruits and vegetables daily;Understanding recommendations for meals to include 15-35% energy as protein, 25-35% energy from fat, 35-60% energy from carbohydrates, less than 2071mof dietary cholesterol, 20-35 gm of total fiber daily;Understanding of distribution of calorie intake throughout the day with the consumption of 4-5 meals/snacks    Hypertension Yes    Intervention Provide education on lifestyle modifcations including regular physical activity/exercise, weight management, moderate sodium restriction and increased consumption of fresh fruit, vegetables, and low fat dairy, alcohol moderation, and smoking cessation.;Monitor prescription use compliance.    Expected Outcomes Short Term: Continued assessment and intervention until BP is < 140/9051mG in hypertensive participants. < 130/58m45m in hypertensive participants with diabetes, heart failure or chronic kidney disease.;Long Term: Maintenance of blood pressure at goal levels.    Lipids Yes    Intervention Provide education and support for participant on nutrition & aerobic/resistive exercise along with prescribed medications to achieve LDL <70mg35mL >40mg.38mExpected Outcomes Short Term: Participant states understanding of desired cholesterol values and is compliant with medications prescribed. Participant is following exercise prescription and nutrition guidelines.;Long Term: Cholesterol controlled  with medications as prescribed, with individualized exercise RX and with personalized nutrition plan. Value goals: LDL < 14m, HDL > 40 mg.             Education:Diabetes - Individual verbal and written  instruction to review signs/symptoms of diabetes, desired ranges of glucose level fasting, after meals and with exercise. Acknowledge that pre and post exercise glucose checks will be done for 3 sessions at entry of program.   Core Components/Risk Factors/Patient Goals Review:   Goals and Risk Factor Review     Row Name 11/03/22 1734 12/11/22 1729 12/24/22 1729         Core Components/Risk Factors/Patient Goals Review   Personal Goals Review Hypertension Hypertension Weight Management/Obesity;Hypertension;Lipids     Review LCurits states that he is checking his blood pressure at home. He has done well since he started the program and is wanting to get back to jogging. He is moving up his levels on each machine. He does not have a whole lot of questions at this time. Informed patient more on what we do here and what he can expect. LAzaiahstates that he is checking his blood pressure at home. He reports that his blood pressures are staying within normal ranges. His systolic pressure has been between [id number] with a diastolic pressure of [id number] He has done well since he started the program and is wanting to get back to jogging. He does not have a whole lot of questions at this time. LSunshinestates he has his next appt with EP to talk about next steps after his cardioversion as it did not take. He is trying to maintain weight, though he has gained 5 lbs since the holidays but admitted he didn't eat well.  He is taking all medications as normal. He gets lipids checked every 6 months and is not due for them just yet. He takes his BP at home which ranges around  115/60s on average. Pressures at rehab are stable as well.     Expected Outcomes Short: maintain adequate blood pressure reading. Long: maintain blood pressure readings independently. Short: Continue to check BP at home. Long: Continue to manage lifestyle risk factors. Short: Continue to watch weight and BP at home Long: Continue to monitor risk factors               Core Components/Risk Factors/Patient Goals at Discharge (Final Review):   Goals and Risk Factor Review - 12/24/22 1729       Core Components/Risk Factors/Patient Goals Review   Personal Goals Review Weight Management/Obesity;Hypertension;Lipids    Review LPatriciastates he has his next appt with EP to talk about next steps after his cardioversion as it did not take. He is trying to maintain weight, though he has gained 5 lbs since the holidays but admitted he didn't eat well.  He is taking all medications as normal. He gets lipids checked every 6 months and is not due for them just yet. He takes his BP at home which ranges around  115/60s on average. Pressures at rehab are stable as well.    Expected Outcomes Short: Continue to watch weight and BP at home Long: Continue to monitor risk factors             ITP Comments:  ITP Comments     Row Name 10/20/22 1019 10/20/22 1506 10/22/22 0823 10/22/22 1646 11/19/22 1053   ITP Comments Initial telephone orientation completed. [facility] orientation scheduled  for Monday 11/27 at 1:30 Completed 6MWT and gym orientation. Initial ITP created and sent for review to Dr. Emily Filbert, Medical Director. 30 Day review completed. Medical Director ITP review done, changes made as directed, and signed approval by Medical Director. New to program First full day of exercise!  Patient was oriented to gym and equipment including functions, settings, policies, and procedures.  Patient's individual exercise prescription and treatment plan were reviewed.  All starting workloads were established based on the results of the 6 minute walk test done at initial orientation visit.  The plan for exercise progression was also introduced and progression will be customized based on patient's performance and goals. 30 Day review completed. Medical Director ITP review done, changes made as directed, and signed approval by Medical Director.     new to program    Row Name 12/17/22 1021 01/14/23 1435         ITP Comments 30 Day review completed. Medical Director ITP review done, changes made as directed, and signed approval by Medical Director. 30 day review completed. ITP sent to Dr. Emily Filbert, Medical Director of Cardiac Rehab. Continue with ITP unless changes are made by physician.               Comments: 30 day review

## 2023-01-15 ENCOUNTER — Encounter: Payer: Managed Care, Other (non HMO) | Admitting: *Deleted

## 2023-01-15 DIAGNOSIS — Z952 Presence of prosthetic heart valve: Secondary | ICD-10-CM

## 2023-01-15 NOTE — Progress Notes (Signed)
Daily Session Note  Patient Details  Name: LENNYN VASBINDER MRN: [id number] Date of Birth: 1961-07-01 Referring Provider:   Flowsheet Row Cardiac Rehab from 10/20/2022 in [facility] Cardiac and Pulmonary Rehab  Referring Provider Paraschos       Encounter Date: 01/15/2023  Check In:  Session Check In - 01/15/23 1720       Check-In   Supervising physician immediately available to respond to emergencies See telemetry face sheet for immediately available ER MD    Location [facility]-Cardiac & Pulmonary Rehab    Staff Present Renita Papa, RN BSN;Joseph Tessie Fass, RCP,RRT,BSRT;Noah Tickle, [place], Exercise Physiologist    Virtual Visit No    Medication changes reported     No    Warm-up and Cool-down Performed on first and last piece of equipment    Resistance Training Performed Yes    VAD Patient? No    PAD/SET Patient? No      Pain Assessment   Currently in Pain? No/denies                Social History   Tobacco Use  Smoking Status Never  Smokeless Tobacco Never    Goals Met:  Independence with exercise equipment Exercise tolerated well No report of concerns or symptoms today Strength training completed today  Goals Unmet:  Not Applicable  Comments: Pt able to follow exercise prescription today without complaint.  Will continue to monitor for progression.    Dr. Emily Filbert is Medical Director for [place].  Dr. Ottie Glazier is Medical Director for [facility] Pulmonary Rehabilitation.

## 2023-01-19 ENCOUNTER — Encounter: Payer: Managed Care, Other (non HMO) | Admitting: *Deleted

## 2023-01-19 DIAGNOSIS — Z952 Presence of prosthetic heart valve: Secondary | ICD-10-CM | POA: Diagnosis not present

## 2023-01-19 NOTE — Progress Notes (Signed)
Daily Session Note  Patient Details  Name: SAHEJ AMBERSON MRN: [id number] Date of Birth: October 07, 1961 Referring Provider:   Flowsheet Row Cardiac Rehab from 10/20/2022 in [facility] Cardiac and Pulmonary Rehab  Referring Provider Paraschos       Encounter Date: 01/19/2023  Check In:  Session Check In - 01/19/23 1725       Check-In   Supervising physician immediately available to respond to emergencies See telemetry face sheet for immediately available ER MD    Location [facility]-Cardiac & Pulmonary Rehab    Staff Present Renita Papa, RN BSN;Joseph Orlovista, RCP,RRT,BSRT;Noah Tickle, [place], Exercise Physiologist;Megan Tamala Julian, RN, [place]    Virtual Visit No    Medication changes reported     No    Warm-up and Cool-down Performed on first and last piece of equipment    Resistance Training Performed Yes    VAD Patient? No      Pain Assessment   Currently in Pain? No/denies                Social History   Tobacco Use  Smoking Status Never  Smokeless Tobacco Never    Goals Met:  Independence with exercise equipment Exercise tolerated well No report of concerns or symptoms today Strength training completed today  Goals Unmet:  Not Applicable  Comments: Pt able to follow exercise prescription today without complaint.  Will continue to monitor for progression.    Dr. Emily Filbert is Medical Director for [place].  Dr. Ottie Glazier is Medical Director for [facility] Pulmonary Rehabilitation.

## 2023-01-21 ENCOUNTER — Encounter: Payer: Managed Care, Other (non HMO) | Admitting: *Deleted

## 2023-01-21 DIAGNOSIS — Z952 Presence of prosthetic heart valve: Secondary | ICD-10-CM | POA: Diagnosis not present

## 2023-01-21 NOTE — Progress Notes (Signed)
Daily Session Note  Patient Details  Name: Antonio [name] MRN: [id number] Date of Birth: January 28, 1961 Referring Provider:   Flowsheet Row Cardiac Rehab from 10/20/2022 in [facility] Cardiac and Pulmonary Rehab  Referring Provider Paraschos       Encounter Date: 01/21/2023  Check In:  Session Check In - 01/21/23 1725       Check-In   Supervising physician immediately available to respond to emergencies See telemetry face sheet for immediately available ER MD    Location [facility]-Cardiac & Pulmonary Rehab    Staff Present Justin Mend, Darla Lesches, MS, ACSM CEP, Exercise Physiologist;Kelly Amedeo Plenty, BS, ACSM CEP, Exercise Physiologist    Virtual Visit No    Medication changes reported     No    Fall or balance concerns reported    No    Warm-up and Cool-down Performed on first and last piece of equipment    Resistance Training Performed Yes    VAD Patient? No    PAD/SET Patient? No      Pain Assessment   Currently in Pain? No/denies                Social History   Tobacco Use  Smoking Status Never  Smokeless Tobacco Never    Goals Met:  Proper associated with RPD/PD & O2 Sat Independence with exercise equipment Exercise tolerated well No report of concerns or symptoms today Strength training completed today  Goals Unmet:  Not Applicable  Comments: Pt able to follow exercise prescription today without complaint.  Will continue to monitor for progression.    Dr. Emily Filbert is Medical Director for [place].  Dr. Ottie Glazier is Medical Director for [facility] Pulmonary Rehabilitation.

## 2023-01-22 ENCOUNTER — Encounter: Payer: Managed Care, Other (non HMO) | Admitting: *Deleted

## 2023-01-22 DIAGNOSIS — Z952 Presence of prosthetic heart valve: Secondary | ICD-10-CM | POA: Diagnosis not present

## 2023-01-22 NOTE — Progress Notes (Signed)
Daily Session Note  Patient Details  Name: Antonio [name] MRN: [id number] Date of Birth: 19-Mar-1961 Referring Provider:   Flowsheet Row Cardiac Rehab from 10/20/2022 in [facility] Cardiac and Pulmonary Rehab  Referring Provider Paraschos       Encounter Date: 01/22/2023  Check In:  Session Check In - 01/22/23 1712       Check-In   Supervising physician immediately available to respond to emergencies See telemetry face sheet for immediately available ER MD    Location [facility]-Cardiac & Pulmonary Rehab    Staff Present Renita Papa, RN BSN;Joseph Tessie Fass, RCP,RRT,BSRT;Kara [place], MS, ACSM CEP, Exercise Physiologist    Virtual Visit No    Medication changes reported     No    Fall or balance concerns reported    No    Warm-up and Cool-down Performed on first and last piece of equipment    Resistance Training Performed Yes    VAD Patient? No    PAD/SET Patient? No      Pain Assessment   Currently in Pain? No/denies                Social History   Tobacco Use  Smoking Status Never  Smokeless Tobacco Never    Goals Met:  Independence with exercise equipment Exercise tolerated well No report of concerns or symptoms today Strength training completed today  Goals Unmet:  Not Applicable  Comments: Pt able to follow exercise prescription today without complaint.  Will continue to monitor for progression.    Dr. Emily Filbert is Medical Director for [place].  Dr. Ottie Glazier is Medical Director for [facility] Pulmonary Rehabilitation.

## 2023-01-26 ENCOUNTER — Encounter: Payer: Managed Care, Other (non HMO) | Attending: Cardiology | Admitting: *Deleted

## 2023-01-26 DIAGNOSIS — Z48812 Encounter for surgical aftercare following surgery on the circulatory system: Secondary | ICD-10-CM | POA: Insufficient documentation

## 2023-01-26 DIAGNOSIS — Z952 Presence of prosthetic heart valve: Secondary | ICD-10-CM | POA: Diagnosis present

## 2023-01-26 NOTE — Progress Notes (Signed)
Daily Session Note  Patient Details  Name: Antonio [name] MRN: [id number] Date of Birth: 03-13-1961 Referring Provider:   Flowsheet Row Cardiac Rehab from 10/20/2022 in [facility] Cardiac and Pulmonary Rehab  Referring Provider Paraschos       Encounter Date: 01/26/2023  Check In:  Session Check In - 01/26/23 1712       Check-In   Supervising physician immediately available to respond to emergencies See telemetry face sheet for immediately available ER MD    Location [facility]-Cardiac & Pulmonary Rehab    Staff Present Renita Papa, RN BSN;Joseph Tessie Fass, RCP,RRT,BSRT;Noah [place], [place], Exercise Physiologist    Virtual Visit No    Medication changes reported     No    Fall or balance concerns reported    No    Warm-up and Cool-down Performed on first and last piece of equipment    Resistance Training Performed Yes    VAD Patient? No    PAD/SET Patient? No      Pain Assessment   Currently in Pain? No/denies                Social History   Tobacco Use  Smoking Status Never  Smokeless Tobacco Never    Goals Met:  Independence with exercise equipment Exercise tolerated well No report of concerns or symptoms today Strength training completed today  Goals Unmet:  Not Applicable  Comments: Pt able to follow exercise prescription today without complaint.  Will continue to monitor for progression.    Dr. Emily Filbert is Medical Director for [place].  Dr. Ottie Glazier is Medical Director for [facility] Pulmonary Rehabilitation.

## 2023-01-28 ENCOUNTER — Encounter: Payer: Managed Care, Other (non HMO) | Admitting: *Deleted

## 2023-01-28 DIAGNOSIS — Z952 Presence of prosthetic heart valve: Secondary | ICD-10-CM | POA: Diagnosis not present

## 2023-01-28 NOTE — Progress Notes (Signed)
Daily Session Note  Patient Details  Name: Antonio [name] MRN: [id number] Date of Birth: 1961/01/10 Referring Provider:   Flowsheet Row Cardiac Rehab from 10/20/2022 in [facility] Cardiac and Pulmonary Rehab  Referring Provider Paraschos       Encounter Date: 01/28/2023  Check In:  Session Check In - 01/28/23 1722       Check-In   Supervising physician immediately available to respond to emergencies See telemetry face sheet for immediately available ER MD    Location [facility]-Cardiac & Pulmonary Rehab    Staff Present Renita Papa, RN Moises Blood, BS, ACSM CEP, Exercise Physiologist;Megan Tamala Julian, RN, ADN    Virtual Visit No    Medication changes reported     No    Fall or balance concerns reported    No    Warm-up and Cool-down Performed on first and last piece of equipment    Resistance Training Performed Yes    VAD Patient? No    PAD/SET Patient? No      Pain Assessment   Currently in Pain? No/denies                Social History   Tobacco Use  Smoking Status Never  Smokeless Tobacco Never    Goals Met:  Independence with exercise equipment Exercise tolerated well No report of concerns or symptoms today Strength training completed today  Goals Unmet:  Not Applicable  Comments:  Antonio [name] graduated today from  rehab with 36 sessions completed.  Details of the patient's exercise prescription and what He needs to do in order to continue the prescription and progress were discussed with patient.  Patient was given a copy of prescription and goals.  Patient verbalized understanding.  Antonio [name] plans to continue to exercise by using home treadmill and walking outdoors.    Dr. Emily Filbert is Medical Director for [place].  Dr. Ottie Glazier is Medical Director for [facility] Pulmonary Rehabilitation.

## 2023-01-28 NOTE — Progress Notes (Signed)
Cardiac Individual Treatment Plan  Patient Details  Name: Antonio [name] MRN: [id number] Date of Birth: July 04, 1961 Referring Provider:   Flowsheet Row Cardiac Rehab from 10/20/2022 in [facility] Cardiac and Pulmonary Rehab  Referring Provider Paraschos       Initial Encounter Date:  Flowsheet Row Cardiac Rehab from 10/20/2022 in [facility] Cardiac and Pulmonary Rehab  Date 10/20/22       Visit Diagnosis: S/P AVR (aortic valve replacement)  Patient's Home Medications on Admission:  Current Outpatient Medications:    acetaminophen (TYLENOL) 500 MG tablet, Take 1,000 mg by mouth every 6 (six) hours as needed (for pain.)., Disp: , Rfl:    aspirin EC 81 MG tablet, Take 81 mg by mouth daily., Disp: , Rfl:    atorvastatin (LIPITOR) 10 MG tablet, Take 10 mg by mouth every evening. , Disp: , Rfl:    cetirizine (ZYRTEC) 10 MG tablet, Take 10 mg by mouth daily., Disp: , Rfl:    loratadine (CLARITIN) 10 MG tablet, Take by mouth., Disp: , Rfl:    midodrine (PROAMATINE) 10 MG tablet, Take 1 tablet (10 mg total) by mouth 3 (three) times daily with meals. (Patient not taking: Reported on 12/03/2022), Disp: , Rfl:    Polyethyl Glycol-Propyl Glycol (LUBRICANT EYE DROPS) 0.4-0.3 % SOLN, Place 1 drop into both eyes 3 (three) times daily as needed (dry/irritated eyes.)., Disp: , Rfl:    tadalafil (CIALIS) 5 MG tablet, Take 1-3 tablets (5-15 mg total) by mouth daily as needed for erectile dysfunction., Disp: 30 tablet, Rfl: 11   tamsulosin (FLOMAX) 0.4 MG CAPS capsule, TAKE 1 CAPSULE(0.4 MG) BY MOUTH DAILY AFTER SUPPER (Patient not taking: Reported on 12/03/2022), Disp: 90 capsule, Rfl: 3   traZODone (DESYREL) 50 MG tablet, Take 0.5 tablets (25 mg total) by mouth at bedtime as needed for sleep., Disp: , Rfl:    warfarin (COUMADIN) 2 MG tablet, Take 2 mg by mouth., Disp: , Rfl:   Past Medical History: Past Medical History:  Diagnosis Date   Aortic aneurysm ([place])    Aortic insufficiency 07/12/2014   Overview:  Mild to  moderate   H/O bicuspid aortic valve 11/04/2013   Heart palpitations 05/15/2017   Hypertension    Metabolic syndrome 99991111   OSA (obstructive sleep apnea) 06/11/2018   no CPAP/None suggested   Premature ventricular contractions 05/28/2017   S/P aortic valve replacement with bioprosthetic valve 11/04/2013   Overview:  With carpentier-edwards bovine pericardial valve and aortic aneurysm repair 03/26/06 by Dr Ysidro Evert at [facility]    Tobacco Use: Social History   Tobacco Use  Smoking Status Never  Smokeless Tobacco Never    Labs: Review Flowsheet        No data to display           Exercise Target Goals: Exercise Program Goal: Individual exercise prescription set using results from initial 6 min walk test and THRR while considering  patient's activity barriers and safety.   Exercise Prescription Goal: Initial exercise prescription builds to 30-45 minutes a day of aerobic activity, 2-3 days per week.  Home exercise guidelines will be given to patient during program as part of exercise prescription that the participant will acknowledge.   Education: Aerobic Exercise: - Group verbal and visual presentation on the components of exercise prescription. Introduces F.I.T.T principle from ACSM for exercise prescriptions.  Reviews F.I.T.T. principles of aerobic exercise including progression. Written material given at graduation.   Education: Resistance Exercise: - Group verbal and visual presentation on the components of  exercise prescription. Introduces F.I.T.T principle from ACSM for exercise prescriptions  Reviews F.I.T.T. principles of resistance exercise including progression. Written material given at graduation.    Education: Exercise & Equipment Safety: - Individual verbal instruction and demonstration of equipment use and safety with use of the equipment. Flowsheet Row Cardiac Rehab from 11/12/2022 in [facility] Cardiac and Pulmonary Rehab  Date 10/20/22  Educator [facility]  Instruction  Review Code 1- Verbalizes Understanding       Education: Exercise Physiology & General Exercise Guidelines: - Group verbal and written instruction with models to review the exercise physiology of the cardiovascular system and associated critical values. Provides general exercise guidelines with specific guidelines to those with heart or lung disease.    Education: Flexibility, Balance, Mind/Body Relaxation: - Group verbal and visual presentation with interactive activity on the components of exercise prescription. Introduces F.I.T.T principle from ACSM for exercise prescriptions. Reviews F.I.T.T. principles of flexibility and balance exercise training including progression. Also discusses the mind body connection.  Reviews various relaxation techniques to help reduce and manage stress (i.e. Deep breathing, progressive muscle relaxation, and visualization). Balance handout provided to take home. Written material given at graduation.   Activity Barriers & Risk Stratification:  Activity Barriers & Cardiac Risk Stratification - 10/20/22 1509       Activity Barriers & Cardiac Risk Stratification   Activity Barriers None    Cardiac Risk Stratification Moderate             6 Minute Walk:  6 Minute Walk     Row Name 10/20/22 1508 01/12/23 1732       6 Minute Walk   Phase Initial Discharge    Distance 1320 feet 1805 feet    Distance % Change -- 36.7 %    Distance Feet Change -- 485 ft    Walk Time 6 minutes 6 minutes    # of Rest Breaks 0 0    MPH 2.5 3.42    METS 3.85 4.63    RPE 11 12    Perceived Dyspnea  0 0    VO2 Peak 13.49 16.22    Symptoms No No    Resting HR 78 bpm 71 bpm    Resting BP 136/86 118/60    Resting Oxygen Saturation  98 % 99 %    Exercise Oxygen Saturation  during 6 min walk 100 % 96 %    Max Ex. HR 104 bpm 109 bpm    Max Ex. BP 138/70 136/62    2 Minute Post BP 120/80 --             Oxygen Initial Assessment:   Oxygen  Re-Evaluation:   Oxygen Discharge (Final Oxygen Re-Evaluation):   Initial Exercise Prescription:  Initial Exercise Prescription - 10/20/22 1500       Date of Initial Exercise RX and Referring Provider   Date 10/20/22    Referring Provider Paraschos      Oxygen   Maintain Oxygen Saturation 88% or higher      Treadmill   MPH 2.7    Grade 1    Minutes 15    METs 3.44      NuStep   Level 3    SPM 80    Minutes 15    METs 3.85      REL-XR   Level 2    Speed 50    Minutes 15    METs 3.85      Biostep-RELP   Level 2  SPM 50    Minutes 15    METs 3.85      Prescription Details   Frequency (times per week) 3    Duration Progress to 30 minutes of continuous aerobic without signs/symptoms of physical distress      Intensity   THRR 40-80% of Max Heartrate 110-143    Ratings of Perceived Exertion 11-13    Perceived Dyspnea 0-4      Progression   Progression Continue to progress workloads to maintain intensity without signs/symptoms of physical distress.      Resistance Training   Training Prescription Yes    Weight 3    Reps 10-15             Perform Capillary Blood Glucose checks as needed.  Exercise Prescription Changes:   Exercise Prescription Changes     Row Name 10/20/22 1500 11/03/22 1500 11/18/22 1500 12/01/22 1300 12/16/22 1300     Response to Exercise   Blood Pressure (Admit) 136/86 136/68 124/76 118/74 122/60   Blood Pressure (Exercise) 138/70 134/66 140/82 140/74 138/66   Blood Pressure (Exit) 120/80 126/68 108/62 102/60 102/64   Heart Rate (Admit) 78 bpm 81 bpm 64 bpm 75 bpm 71 bpm   Heart Rate (Exercise) 104 bpm 127 bpm 141 bpm 146 bpm 134 bpm   Heart Rate (Exit) 83 bpm 99 bpm 92 bpm 88 bpm 87 bpm   Oxygen Saturation (Admit) 98 % -- -- -- --   Oxygen Saturation (Exercise) 100 % -- -- -- --   Oxygen Saturation (Exit) 100 % -- -- -- --   Rating of Perceived Exertion (Exercise) '11 11 13 13 13   '$ Perceived Dyspnea (Exercise) 0 -- -- --  --   Symptoms none none none none none   Comments 6 MWT results 2nd full day of exercise -- -- --   Duration -- Continue with 30 min of aerobic exercise without signs/symptoms of physical distress. Continue with 30 min of aerobic exercise without signs/symptoms of physical distress. Continue with 30 min of aerobic exercise without signs/symptoms of physical distress. Continue with 30 min of aerobic exercise without signs/symptoms of physical distress.   Intensity -- THRR unchanged THRR unchanged THRR unchanged THRR unchanged     Progression   Progression -- Continue to progress workloads to maintain intensity without signs/symptoms of physical distress. Continue to progress workloads to maintain intensity without signs/symptoms of physical distress. Continue to progress workloads to maintain intensity without signs/symptoms of physical distress. Continue to progress workloads to maintain intensity without signs/symptoms of physical distress.   Average METs -- 3.73 4.38 5.39 5.32     Resistance Training   Training Prescription -- Yes Yes Yes Yes   Weight -- 3 lb 3 lb 7 lb 7 lb   Reps -- 10-15 10-15 10-15 10-15     Interval Training   Interval Training -- No No Yes Yes   Equipment -- -- -- Treadmill Treadmill   Comments -- -- -- incline up to 5 % incline up to 5 %     Treadmill   MPH -- 3 3.5 3.5  intervals 3.7  intervals   Grade -- '1 2 5 5   '$ Minutes -- '15 15 15 15   '$ METs -- 3.71 4.65 -- 6.09     NuStep   Level -- -- -- 4 4   Minutes -- -- -- 15 15   METs -- -- -- 5.8 5     Elliptical   Level -- --  2 -- --   Speed -- -- 4.5 -- --   Minutes -- -- 15 -- --     REL-XR   Level -- '3 5 7 7   '$ Minutes -- '15 15 15 15   '$ METs -- 3.8 -- 6.1 --     Biostep-RELP   Level -- 2 3 -- --   Minutes -- 15 15 -- --   METs -- 4 4 -- --     Oxygen   Maintain Oxygen Saturation -- 88% or higher 88% or higher 88% or higher 88% or higher    Row Name 12/24/22 1700 12/29/22 1500 01/13/23 1500  01/26/23 1500       Response to Exercise   Blood Pressure (Admit) -- 122/76 104/60 102/58    Blood Pressure (Exit) -- '98/60 98/62 96/58 '$    Heart Rate (Admit) -- 85 bpm 71 bpm 82 bpm    Heart Rate (Exercise) -- 139 bpm 141 bpm 132 bpm    Heart Rate (Exit) -- 86 bpm 80 bpm 86 bpm    Rating of Perceived Exertion (Exercise) -- '13 13 13    '$ Symptoms -- none none none    Duration -- Continue with 30 min of aerobic exercise without signs/symptoms of physical distress. Continue with 30 min of aerobic exercise without signs/symptoms of physical distress. Continue with 30 min of aerobic exercise without signs/symptoms of physical distress.    Intensity -- THRR unchanged THRR unchanged THRR unchanged      Progression   Progression -- Continue to progress workloads to maintain intensity without signs/symptoms of physical distress. Continue to progress workloads to maintain intensity without signs/symptoms of physical distress. Continue to progress workloads to maintain intensity without signs/symptoms of physical distress.    Average METs -- 6.02 6.26 7.34      Resistance Training   Training Prescription -- Yes Yes Yes    Weight -- 7 lb 7 lb 7 lb    Reps -- 10-15 10-15 10-15      Interval Training   Interval Training -- Yes Yes --    Equipment -- Treadmill Treadmill --    Comments -- incline up to 5% incline up to 5% --      Treadmill   MPH -- 3.9 3.9 3.5    Grade -- '5 5 8    '$ Minutes -- '15 15 15    '$ METs -- 6.67 6.67 7.5      NuStep   Level -- 5 5 --    Minutes -- 15 15 --    METs -- 7.9 6.8 --      Elliptical   Level -- -- 3 3    Speed -- -- 4.5 4.5    Minutes -- -- 15 15      REL-XR   Level -- '7 7 9    '$ Minutes -- '15 15 15    '$ METs -- 5.4 6 9.4      Home Exercise Plan   Plans to continue exercise at Home (comment)  walking at treadmill and outdoors -- Home (comment)  walking at treadmill and outdoors Home (comment)  walking at treadmill and outdoors    Frequency Add 2 additional  days to program exercise sessions. -- Add 2 additional days to program exercise sessions. Add 2 additional days to program exercise sessions.    Initial Home Exercises Provided 12/24/22 -- 12/24/22 12/24/22      Oxygen   Maintain Oxygen Saturation 88% or higher -- 88%  or higher 88% or higher             Exercise Comments:   Exercise Comments     Row Name 10/22/22 1646           Exercise Comments First full day of exercise!  Patient was oriented to gym and equipment including functions, settings, policies, and procedures.  Patient's individual exercise prescription and treatment plan were reviewed.  All starting workloads were established based on the results of the 6 minute walk test done at initial orientation visit.  The plan for exercise progression was also introduced and progression will be customized based on patient's performance and goals.                Exercise Goals and Review:   Exercise Goals     Row Name 10/20/22 1516             Exercise Goals   Increase Physical Activity Yes       Intervention Provide advice, education, support and counseling about physical activity/exercise needs.;Develop an individualized exercise prescription for aerobic and resistive training based on initial evaluation findings, risk stratification, comorbidities and participant's personal goals.       Expected Outcomes Short Term: Attend rehab on a regular basis to increase amount of physical activity.;Long Term: Add in home exercise to make exercise part of routine and to increase amount of physical activity.;Long Term: Exercising regularly at least 3-5 days a week.       Increase Strength and Stamina Yes       Intervention Provide advice, education, support and counseling about physical activity/exercise needs.;Develop an individualized exercise prescription for aerobic and resistive training based on initial evaluation findings, risk stratification, comorbidities and participant's  personal goals.       Expected Outcomes Short Term: Increase workloads from initial exercise prescription for resistance, speed, and METs.;Short Term: Perform resistance training exercises routinely during rehab and add in resistance training at home;Long Term: Improve cardiorespiratory fitness, muscular endurance and strength as measured by increased METs and functional capacity (6MWT)       Able to understand and use rate of perceived exertion (RPE) scale Yes       Intervention Provide education and explanation on how to use RPE scale       Expected Outcomes Short Term: Able to use RPE daily in rehab to express subjective intensity level;Long Term:  Able to use RPE to guide intensity level when exercising independently       Able to understand and use Dyspnea scale Yes       Intervention Provide education and explanation on how to use Dyspnea scale       Expected Outcomes Short Term: Able to use Dyspnea scale daily in rehab to express subjective sense of shortness of breath during exertion;Long Term: Able to use Dyspnea scale to guide intensity level when exercising independently       Knowledge and understanding of Target Heart Rate Range (THRR) Yes       Intervention Provide education and explanation of THRR including how the numbers were predicted and where they are located for reference       Expected Outcomes Short Term: Able to state/look up THRR;Long Term: Able to use THRR to govern intensity when exercising independently;Short Term: Able to use daily as guideline for intensity in rehab       Able to check pulse independently Yes       Intervention Provide education and demonstration on how  to check pulse in carotid and radial arteries.;Review the importance of being able to check your own pulse for safety during independent exercise       Expected Outcomes Short Term: Able to explain why pulse checking is important during independent exercise       Understanding of Exercise Prescription Yes        Intervention Provide education, explanation, and written materials on patient's individual exercise prescription       Expected Outcomes Short Term: Able to explain program exercise prescription;Long Term: Able to explain home exercise prescription to exercise independently                Exercise Goals Re-Evaluation :  Exercise Goals Re-Evaluation     Row Name 10/22/22 1646 11/03/22 1554 11/18/22 1510 12/01/22 1348 12/11/22 1720     Exercise Goal Re-Evaluation   Exercise Goals Review Increase Physical Activity;Able to understand and use rate of perceived exertion (RPE) scale;Knowledge and understanding of Target Heart Rate Range (THRR);Understanding of Exercise Prescription;Increase Strength and Stamina;Able to check pulse independently Increase Physical Activity;Increase Strength and Stamina;Understanding of Exercise Prescription Increase Physical Activity;Increase Strength and Stamina;Understanding of Exercise Prescription Increase Physical Activity;Increase Strength and Stamina;Understanding of Exercise Prescription Increase Physical Activity;Increase Strength and Stamina;Understanding of Exercise Prescription   Comments Reviewed RPE scale, THR and program prescription with pt today.  Pt voiced understanding and was given a copy of goals to take home. Martis is off to a good start with rehab for the first couple of sessions he has been here. He increased to 3 mph/ 1% incline on the treadmill and RPEs were on the lighter end. He hit his THR both sessions thus far. We will continue to monitor as he progresses in the program. Ahmier continues to do well in rehab. He recently increased his overall average MET level to 4.38 METs. He also improved to level 5 on the XR and level 3 on the biostep. he increased his workload on the treadmill as well, to a speed of 3.5 mph and an incline of 2%. We will continue to monitor his progress in the program. Boby continues to do well in rehab. He started doing  intervals on the treadmill, going up to 5% incline with a steady state speed of 3.5 mph. He also increased to level 7 on the elliptical and is now using 7 lbs for handweights. He is most definitely hitting his THR each time. We will continue to monitor as he progresses in the program. Akiel states that he is doing well in rehab. He states he feels he could do more but his restrictions are keeping him from doing so. He also states that he has increased his strength and stamina. He also reports feeling better mentally since starting the program. Amadu states that he has been walking 3-4 miles at least 3 times a week on his days away from rehab.   Expected Outcomes Short: Use RPE daily to regulate intensity.  Long: Follow program prescription in THR. Short: Follow exercise prescription, increase workload on seated machines slowly Long: Increase overall MET level Short: Continue to increase workloads. Long: Continue to improve strength and stamina. Short: Continue to work on intervals with treadmill, add to XR Long: Continue to increase overall MET level Short: Continue to walk on days away from rehab. Long: Continue to improve strength and stamina.    [place] Name 12/16/22 1351 12/24/22 1736 12/29/22 1523 01/13/23 1527 01/26/23 1547     Exercise Goal Re-Evaluation   Exercise  Goals Review Increase Physical Activity;Increase Strength and Stamina;Understanding of Exercise Prescription Increase Physical Activity;Increase Strength and Stamina;Understanding of Exercise Prescription Increase Physical Activity;Increase Strength and Stamina;Understanding of Exercise Prescription Increase Physical Activity;Increase Strength and Stamina;Understanding of Exercise Prescription Increase Physical Activity;Increase Strength and Stamina;Understanding of Exercise Prescription   Comments Rigoverto continues to do well in rehab. He has consistently worked at an average MET level above 5 METs. He also has continued to do intervals of incline  on the treadmill from 3-5% at an increased speed of 3.7 mph. He is doing well with 7 lb hand weights for resistance training as well. We will continue to monitor his progress. Reviewed home exercise with pt today.  Pt plans to walk on his treadmill at home and outdoors for exercise.  Reviewed THR, pulse, RPE, sign and symptoms, pulse oximetery and when to call 911 or MD.  Also discussed weather considerations and indoor options.  Pt voiced understanding. He does check his HR with his fitness watch routinely. Reviewed [place]. He does want to add a little more weight training at home as he has his own hand weights to use. Lex continues to do well in rehab. He continues to work on intervals on the treadmill up to 5%, in addition to that, he increased his speed on the treadmill to 3.9 mph. He also increased his workload on the T4 Nustep to level 5. He continues to reach his THR each session with appropriate RPEs. Will continue to monitor. Azariah continues to do well in rehab. He recently increased his overall average MET level to 6.26 METs. He improved to level 3 on the elliptical as well, at a speed of 4.5 mph. He also completed his post 6MWT and improved by 36.7%. We will continue to monitor his progress in the program. Elieser continues to do well in rehab. He has increased to level 9 on the XR working at 9.4 METS! He also increased to working at a 8% incline on the treadmill. He is due to graduate this week and will continue to monitor until then.   Expected Outcomes Short: Continue interval training on the treadmill Long: Continue to improve strength and stamina. Short: Continue monitoring HR during exericse Long: Continue to exercise at home Short:Continue intervals on treadmill Long: Continue to increase overall MET level and stamina Short: Continue to increase workload on the elliptical. Long: Continue to improve strength and stamina. Short: Graduate Long: Continue to increase overall MET level and stamina             Discharge Exercise Prescription (Final Exercise Prescription Changes):  Exercise Prescription Changes - 01/26/23 1500       Response to Exercise   Blood Pressure (Admit) 102/58    Blood Pressure (Exit) 96/58    Heart Rate (Admit) 82 bpm    Heart Rate (Exercise) 132 bpm    Heart Rate (Exit) 86 bpm    Rating of Perceived Exertion (Exercise) 13    Symptoms none    Duration Continue with 30 min of aerobic exercise without signs/symptoms of physical distress.    Intensity THRR unchanged      Progression   Progression Continue to progress workloads to maintain intensity without signs/symptoms of physical distress.    Average METs 7.34      Resistance Training   Training Prescription Yes    Weight 7 lb    Reps 10-15      Treadmill   MPH 3.5    Grade 8    Minutes  15    METs 7.5      Elliptical   Level 3    Speed 4.5    Minutes 15      REL-XR   Level 9    Minutes 15    METs 9.4      Home Exercise Plan   Plans to continue exercise at Home (comment)   walking at treadmill and outdoors   Frequency Add 2 additional days to program exercise sessions.    Initial Home Exercises Provided 12/24/22      Oxygen   Maintain Oxygen Saturation 88% or higher             Nutrition:  Target Goals: Understanding of nutrition guidelines, daily intake of sodium '1500mg'$ , cholesterol '200mg'$ , calories 30% from fat and 7% or less from saturated fats, daily to have 5 or more servings of fruits and vegetables.  Education: All About Nutrition: -Group instruction provided by verbal, written material, interactive activities, discussions, models, and posters to present general guidelines for heart healthy nutrition including fat, fiber, MyPlate, the role of sodium in heart healthy nutrition, utilization of the nutrition label, and utilization of this knowledge for meal planning. Follow up email sent as well. Written material given at graduation. Flowsheet Row Cardiac Rehab from  11/12/2022 in [facility] Cardiac and Pulmonary Rehab  Date 10/22/22  Educator [facility]  Instruction Review Code 1- Verbalizes Understanding       Biometrics:  Pre Biometrics - 10/20/22 1517       Pre Biometrics   Height '5\' 10"'$  (1.778 m)    Weight 166 lb 1.6 oz (75.3 kg)    Waist Circumference 32 inches    Hip Circumference 38 inches    Waist to Hip Ratio 0.84 %    BMI (Calculated) 23.83    Single Leg Stand 30 seconds             Post Biometrics - 01/12/23 1736        Post  Biometrics   Height '5\' 10"'$  (1.778 m)    Weight 176 lb 14.4 oz (80.2 kg)    Waist Circumference 32 inches    Hip Circumference 35.5 inches    Waist to Hip Ratio 0.9 %    BMI (Calculated) 25.38    Single Leg Stand 30 seconds             Nutrition Therapy Plan and Nutrition Goals:  Nutrition Therapy & Goals - 10/20/22 1432       Nutrition Therapy   Diet Heart healthy, low Na    Drug/Food Interactions Coumadin/Vit K    Protein (specify units) 60-70g    Fiber 30 grams    Whole Grain Foods 3 servings    Saturated Fats 16 max. grams    Fruits and Vegetables 8 servings/day    Sodium 2 grams      Personal Nutrition Goals   Nutrition Goal ST: re-intoduce vitamin K rich food in consistent amounts with his healthcare team, improve food variety such as eating the rainbow and varying protein, include at least 1 source of healthy fat at most meals like nuts/seeds in salad LT: Follow MyPlate guide for most meals, continue with heart healthy eating pattern, increase variety    Comments 61 y.o. M admitted to cardiac rehab s/p AVR. PMHx includes acute CHF, a.fib, aortic insufficiency, OSA, dyslipidemia, prostate cancer, metabolic syndrome, s/p AVR 2007. Relevant medications includes lipitor, warfarin, trazodone, midodrine, tadalafil. Ksean reports that he has tried dieting before, but kept "  yoyo-ing", however he has found an eating pattern he could stick with which is lower in fat and carbohydrates, but he reports  including carbohydrates packaged with fiber like smaller amounts of whole grains, fruit, and limited starchy vegetables. Discussed making sure to have a good source of healthy fat at most meals to make sure he can absorb fat soluble nutrients and to include energy and fiber-rich carbohydrates as well.  B: 1 whole egg and 2 egg whites with spinach, blueberries, and a piece of bacon. Coffee with some splenda. L: chicken (air fry or bake) with a salad D: chicken and non-starchy vegetables. he uses some spray to keep it from sticking and some olive oil as well as spices. He has been limiting salt. Drinks: water or unsweet tea with 1 diet coke per day. Reviewed heart healthy eating and warfarin MNT - Nicklos was nervous about his vitamin K intake - discussed new guidelines on vitamin K consistency and provided handouts. Encouraged variety of foods.      Intervention Plan   Intervention Prescribe, educate and counsel regarding individualized specific dietary modifications aiming towards targeted core components such as weight, hypertension, lipid management, diabetes, heart failure and other comorbidities.;Nutrition handout(s) given to patient.    Expected Outcomes Short Term Goal: Understand basic principles of dietary content, such as calories, fat, sodium, cholesterol and nutrients.;Short Term Goal: A plan has been developed with personal nutrition goals set during dietitian appointment.;Long Term Goal: Adherence to prescribed nutrition plan.             Nutrition Assessments:  MEDIFICTS Score Key: ?70 Need to make dietary changes  40-70 Heart Healthy Diet ? 40 Therapeutic Level Cholesterol Diet  Flowsheet Row Cardiac Rehab from 01/28/2023 in [facility]  Picture Your Plate Total Score on Discharge 75      Picture Your Plate Scores: [id number] Unhealthy dietary pattern with much room for improvement. 41-50 Dietary pattern unlikely to meet recommendations for good health and room for  improvement. 51-60 More healthful dietary pattern, with some room for improvement.  >60 Healthy dietary pattern, although there may be some specific behaviors that could be improved.    Nutrition Goals Re-Evaluation:  Nutrition Goals Re-Evaluation     [place] Name 11/03/22 1736 12/11/22 1727 12/24/22 1733         Goals   Current Weight 168 lb (76.2 kg) -- --     Nutrition Goal No questions at this time. Prayag states he is happy with where his diet is at right now. He states that he did have excess sugar with all the sweets over the holidays. However, he plans on doing better at cutting out sweets over the next month. ST: re-intoduce vitamin K rich food in consistent amounts with his healthcare team, improve food variety such as eating the rainbow and varying protein, include at least 1 source of healthy fat at most meals like nuts/seeds in salad LT: Follow MyPlate guide for most meals, continue with heart healthy eating pattern, increase variety     Comment Patient states that he is eating well and has no questions on his diet. Short: Cut down on sweet snacks. Long: Continue heart-healthy eating patterns. Samere has been eating avocado and salkon for healthy fats.  He eats egg whites for breakfast, grilled chicken, black beans for protein sources. Once a week for red meat. He is staying away from sugars. He has to refrain from leafy greens because of his INR check.  He nornally eats  brussel sprouts, asparagus, carrots, sweet potato, green beans, broccoli for vegetables. He denies other concerns with his diet at this time.     Expected Outcome -- -- Short: Continue focusing on protein intake, variety of fruits/vegetables Long: Continue to eat heart healthy diet              Nutrition Goals Discharge (Final Nutrition Goals Re-Evaluation):  Nutrition Goals Re-Evaluation - 12/24/22 1733       Goals   Nutrition Goal ST: re-intoduce vitamin K rich food in consistent amounts with his healthcare team,  improve food variety such as eating the rainbow and varying protein, include at least 1 source of healthy fat at most meals like nuts/seeds in salad LT: Follow MyPlate guide for most meals, continue with heart healthy eating pattern, increase variety    Comment Corlis has been eating avocado and salkon for healthy fats.  He eats egg whites for breakfast, grilled chicken, black beans for protein sources. Once a week for red meat. He is staying away from sugars. He has to refrain from leafy greens because of his INR check.  He nornally eats brussel sprouts, asparagus, carrots, sweet potato, green beans, broccoli for vegetables. He denies other concerns with his diet at this time.    Expected Outcome Short: Continue focusing on protein intake, variety of fruits/vegetables Long: Continue to eat heart healthy diet             Psychosocial: Target Goals: Acknowledge presence or absence of significant depression and/or stress, maximize coping skills, provide positive support system. Participant is able to verbalize types and ability to use techniques and skills needed for reducing stress and depression.   Education: Stress, Anxiety, and Depression - Group verbal and visual presentation to define topics covered.  Reviews how body is impacted by stress, anxiety, and depression.  Also discusses healthy ways to reduce stress and to treat/manage anxiety and depression.  Written material given at graduation.   Education: Sleep Hygiene -Provides group verbal and written instruction about how sleep can affect your health.  Define sleep hygiene, discuss sleep cycles and impact of sleep habits. Review good sleep hygiene tips.    Initial Review & Psychosocial Screening:  Initial Psych Review & Screening - 10/20/22 1010       Initial Review   Current issues with None Identified      Family Dynamics   Good Support System? Yes   wife, children, family     Barriers   Psychosocial barriers to participate in  program There are no identifiable barriers or psychosocial needs.;The patient should benefit from training in stress management and relaxation.      Screening Interventions   Interventions Encouraged to exercise;Provide feedback about the scores to participant;To provide support and resources with identified psychosocial needs    Expected Outcomes Short Term goal: Utilizing psychosocial counselor, staff and physician to assist with identification of specific Stressors or current issues interfering with healing process. Setting desired goal for each stressor or current issue identified.;Long Term Goal: Stressors or current issues are controlled or eliminated.;Short Term goal: Identification and review with participant of any Quality of Life or Depression concerns found by scoring the questionnaire.;Long Term goal: The participant improves quality of Life and PHQ9 Scores as seen by post scores and/or verbalization of changes             Quality of Life Scores:   Quality of Life - 01/28/23 1724       Quality of Life  Select Quality of Life      Quality of Life Scores   Health/Function Pre 26.67 %    Health/Function Post 28 %    Health/Function % Change 4.99 %    Socioeconomic Pre 27.86 %    Socioeconomic Post 28.21 %    Socioeconomic % Change  1.26 %    Psych/Spiritual Pre 29.29 %    Psych/Spiritual Post 28.57 %    Psych/Spiritual % Change -2.46 %    Family Pre 27.5 %    Family Post 30 %    Family % Change 9.09 %    GLOBAL Pre 27.57 %    GLOBAL Post 28.47 %    GLOBAL % Change 3.26 %            Scores of 19 and below usually indicate a poorer quality of life in these areas.  A difference of  2-3 points is a clinically meaningful difference.  A difference of 2-3 points in the total score of the Quality of Life Index has been associated with significant improvement in overall quality of life, self-image, physical symptoms, and general health in studies assessing change in quality  of life.  PHQ-9: Review Flowsheet       01/28/2023 10/20/2022  Depression screen PHQ 2/9  Decreased Interest 0 0  Down, Depressed, Hopeless 0 0  PHQ - 2 Score 0 0  Altered sleeping 0 0  Tired, decreased energy 0 0  Change in appetite 0 0  Feeling bad or failure about yourself  0 0  Trouble concentrating 0 0  Moving slowly or fidgety/restless 0 0  Suicidal thoughts 0 0  PHQ-9 Score 0 0  Difficult doing work/chores - Not difficult at all   Interpretation of Total Score  Total Score Depression Severity:  1-4 = Minimal depression, 5-9 = Mild depression, 10-14 = Moderate depression, 15-19 = Moderately severe depression, 20-27 = Severe depression   Psychosocial Evaluation and Intervention:  Psychosocial Evaluation - 10/20/22 1020       Psychosocial Evaluation & Interventions   Interventions Encouraged to exercise with the program and follow exercise prescription    Comments Mr. Sparta is coming to cardiac rehab post AVR. He had one done in 2007 so he is familiar with the program and process. He reports no stress concerns or sleep issues. He has a very supporting family and friend circle. He states recovery has been smooth so far. He is quite active and wants to get back to exercising like he previously was.    Expected Outcomes Short: attend cardiac rehab for education and exercise. Long: develop and maintian positive self care habits.    Continue Psychosocial Services  Follow up required by staff             Psychosocial Re-Evaluation:  Psychosocial Re-Evaluation     [place] Name 11/03/22 1737 12/11/22 1725 12/24/22 1739         Psychosocial Re-Evaluation   Current issues with None Identified None Identified None Identified     Comments Patient reports no issues with their current mental states, sleep, stress, depression or anxiety. Will follow up with patient in a few weeks for any changes. Marguis states that he is doing well mentally and has no current stressors. He says that he  is happy with his job and states that He "is very blessed." He reports sleeping well also. He feels that exercise has really helped his mental state as well. Mcarthur has no complaints so far and  says "life is good." He is awaiting to hear from his EP next week on his next steps for his heart as he completed his cardioversion but it didn't take. He has good support at home, no sleep issues,  not on any medications for emotional support. He loves to exercise as his stress relief which he does almost everyday. He is enjoying coming to rehab and denies other conerns at this time.     Expected Outcomes Short: Continue to exercise regularly to support mental health and notify staff of any changes. Long: maintain mental health and well being through teaching of rehab or prescribed medications independently. Short: Continue to exercise regularly to support mental health and notify staff of any changes. Long: maintain mental health and well being through teaching of rehab or prescribed medications independently. ShorT: Continue to come to rehab for mood boost Long: Continue to maintain positive attitude     Interventions Encouraged to attend Cardiac Rehabilitation for the exercise Encouraged to attend Cardiac Rehabilitation for the exercise Encouraged to attend Cardiac Rehabilitation for the exercise     Continue Psychosocial Services  Follow up required by staff Follow up required by staff Follow up required by staff              Psychosocial Discharge (Final Psychosocial Re-Evaluation):  Psychosocial Re-Evaluation - 12/24/22 1739       Psychosocial Re-Evaluation   Current issues with None Identified    Comments Gaylan has no complaints so far and says "life is good." He is awaiting to hear from his EP next week on his next steps for his heart as he completed his cardioversion but it didn't take. He has good support at home, no sleep issues,  not on any medications for emotional support. He loves to exercise as  his stress relief which he does almost everyday. He is enjoying coming to rehab and denies other conerns at this time.    Expected Outcomes ShorT: Continue to come to rehab for mood boost Long: Continue to maintain positive attitude    Interventions Encouraged to attend Cardiac Rehabilitation for the exercise    Continue Psychosocial Services  Follow up required by staff             Vocational Rehabilitation: Provide vocational rehab assistance to qualifying candidates.   Vocational Rehab Evaluation & Intervention:  Vocational Rehab - 10/20/22 1010       Initial Vocational Rehab Evaluation & Intervention   Assessment shows need for Vocational Rehabilitation No             Education: Education Goals: Education classes will be provided on a variety of topics geared toward better understanding of heart health and risk factor modification. Participant will state understanding/return demonstration of topics presented as noted by education test scores.  Learning Barriers/Preferences:  Learning Barriers/Preferences - 10/20/22 1020       Learning Barriers/Preferences   Learning Barriers None    Learning Preferences None             General Cardiac Education Topics:  AED/CPR: - Group verbal and written instruction with the use of models to demonstrate the basic use of the AED with the basic ABC's of resuscitation.   Anatomy and Cardiac Procedures: - Group verbal and visual presentation and models provide information about basic cardiac anatomy and function. Reviews the testing methods done to diagnose heart disease and the outcomes of the test results. Describes the treatment choices: Medical Management, Angioplasty, or Coronary Bypass Surgery for  treating various heart conditions including Myocardial Infarction, Angina, Valve Disease, and Cardiac Arrhythmias.  Written material given at graduation. Flowsheet Row Cardiac Rehab from 11/12/2022 in [facility] Cardiac and Pulmonary  Rehab  Education need identified 10/20/22       Medication Safety: - Group verbal and visual instruction to review commonly prescribed medications for heart and lung disease. Reviews the medication, class of the drug, and side effects. Includes the steps to properly store meds and maintain the prescription regimen.  Written material given at graduation.   Intimacy: - Group verbal instruction through game format to discuss how heart and lung disease can affect sexual intimacy. Written material given at graduation..   Know Your Numbers and Heart Failure: - Group verbal and visual instruction to discuss disease risk factors for cardiac and pulmonary disease and treatment options.  Reviews associated critical values for Overweight/Obesity, Hypertension, Cholesterol, and Diabetes.  Discusses basics of heart failure: signs/symptoms and treatments.  Introduces Heart Failure Zone chart for action plan for heart failure.  Written material given at graduation. Flowsheet Row Cardiac Rehab from 11/12/2022 in [facility] Cardiac and Pulmonary Rehab  Date 11/12/22  Educator SB  Instruction Review Code 1- Verbalizes Understanding       Infection Prevention: - Provides verbal and written material to individual with discussion of infection control including proper hand washing and proper equipment cleaning during exercise session. Flowsheet Row Cardiac Rehab from 11/12/2022 in [facility] Cardiac and Pulmonary Rehab  Date 10/20/22  Educator [facility]  Instruction Review Code 1- Verbalizes Understanding       Falls Prevention: - Provides verbal and written material to individual with discussion of falls prevention and safety. Flowsheet Row Cardiac Rehab from 11/12/2022 in [facility] Cardiac and Pulmonary Rehab  Date 10/20/22  Educator [facility]  Instruction Review Code 1- Verbalizes Understanding       Other: -Provides group and verbal instruction on various topics (see comments)   Knowledge Questionnaire Score:   Knowledge Questionnaire Score - 01/28/23 1724       Knowledge Questionnaire Score   Post Score 26/26             Core Components/Risk Factors/Patient Goals at Admission:  Personal Goals and Risk Factors at Admission - 10/20/22 1527       Core Components/Risk Factors/Patient Goals on Admission    Weight Management Yes    Intervention Weight Management: Develop a combined nutrition and exercise program designed to reach desired caloric intake, while maintaining appropriate intake of nutrient and fiber, sodium and fats, and appropriate energy expenditure required for the weight goal.;Weight Management: Provide education and appropriate resources to help participant work on and attain dietary goals.    Admit Weight 166 lb 1.6 oz (75.3 kg)    Goal Weight: Short Term 166 lb (75.3 kg)    Goal Weight: Long Term 166 lb (75.3 kg)    Expected Outcomes Short Term: Continue to assess and modify interventions until short term weight is achieved;Long Term: Adherence to nutrition and physical activity/exercise program aimed toward attainment of established weight goal;Weight Maintenance: Understanding of the daily nutrition guidelines, which includes 25-35% calories from fat, 7% or less cal from saturated fats, less than '200mg'$  cholesterol, less than 1.5gm of sodium, & 5 or more servings of fruits and vegetables daily;Understanding recommendations for meals to include 15-35% energy as protein, 25-35% energy from fat, 35-60% energy from carbohydrates, less than '200mg'$  of dietary cholesterol, 20-35 gm of total fiber daily;Understanding of distribution of calorie intake throughout the day with the  consumption of 4-5 meals/snacks    Hypertension Yes    Intervention Provide education on lifestyle modifcations including regular physical activity/exercise, weight management, moderate sodium restriction and increased consumption of fresh fruit, vegetables, and low fat dairy, alcohol moderation, and smoking  cessation.;Monitor prescription use compliance.    Expected Outcomes Short Term: Continued assessment and intervention until BP is < 140/35m HG in hypertensive participants. < 130/853mHG in hypertensive participants with diabetes, heart failure or chronic kidney disease.;Long Term: Maintenance of blood pressure at goal levels.    Lipids Yes    Intervention Provide education and support for participant on nutrition & aerobic/resistive exercise along with prescribed medications to achieve LDL '70mg'$ , HDL >'40mg'$ .    Expected Outcomes Short Term: Participant states understanding of desired cholesterol values and is compliant with medications prescribed. Participant is following exercise prescription and nutrition guidelines.;Long Term: Cholesterol controlled with medications as prescribed, with individualized exercise RX and with personalized nutrition plan. Value goals: LDL < '70mg'$ , HDL > 40 mg.             Education:Diabetes - Individual verbal and written instruction to review signs/symptoms of diabetes, desired ranges of glucose level fasting, after meals and with exercise. Acknowledge that pre and post exercise glucose checks will be done for 3 sessions at entry of program.   Core Components/Risk Factors/Patient Goals Review:   Goals and Risk Factor Review     Row Name 11/03/22 1734 12/11/22 1729 12/24/22 1729         Core Components/Risk Factors/Patient Goals Review   Personal Goals Review Hypertension Hypertension Weight Management/Obesity;Hypertension;Lipids     Review LyLibanstates that he is checking his blood pressure at home. He has done well since he started the program and is wanting to get back to jogging. He is moving up his levels on each machine. He does not have a whole lot of questions at this time. Informed patient more on what we do here and what he can expect. LyYancytates that he is checking his blood pressure at home. He reports that his blood pressures are staying within  normal ranges. His systolic pressure has been between [id number] a diastolic pressure of [id number] has done well since he started the program and is wanting to get back to jogging. He does not have a whole lot of questions at this time. LyEdmundtates he has his next appt with EP to talk about next steps after his cardioversion as it did not take. He is trying to maintain weight, though he has gained 5 lbs since the holidays but admitted he didn't eat well.  He is taking all medications as normal. He gets lipids checked every 6 months and is not due for them just yet. He takes his BP at home which ranges around  115/60s on average. Pressures at rehab are stable as well.     Expected Outcomes Short: maintain adequate blood pressure reading. Long: maintain blood pressure readings independently. Short: Continue to check BP at home. Long: Continue to manage lifestyle risk factors. Short: Continue to watch weight and BP at home Long: Continue to monitor risk factors              Core Components/Risk Factors/Patient Goals at Discharge (Final Review):   Goals and Risk Factor Review - 12/24/22 1729       Core Components/Risk Factors/Patient Goals Review   Personal Goals Review Weight Management/Obesity;Hypertension;Lipids    Review LySabreetates he has his next appt with  EP to talk about next steps after his cardioversion as it did not take. He is trying to maintain weight, though he has gained 5 lbs since the holidays but admitted he didn't eat well.  He is taking all medications as normal. He gets lipids checked every 6 months and is not due for them just yet. He takes his BP at home which ranges around  115/60s on average. Pressures at rehab are stable as well.    Expected Outcomes Short: Continue to watch weight and BP at home Long: Continue to monitor risk factors             ITP Comments:  ITP Comments     Row Name 10/20/22 1019 10/20/22 1506 10/22/22 0823 10/22/22 1646 11/19/22 1053   ITP  Comments Initial telephone orientation completed. Diagnosis can be found in [facility] 10/19. EP orientation scheduled for Monday 11/27 at 1:30 Completed 6MWT and gym orientation. Initial ITP created and sent for review to Dr. Emily Filbert, Medical Director. 30 Day review completed. Medical Director ITP review done, changes made as directed, and signed approval by Medical Director. New to program First full day of exercise!  Patient was oriented to gym and equipment including functions, settings, policies, and procedures.  Patient's individual exercise prescription and treatment plan were reviewed.  All starting workloads were established based on the results of the 6 minute walk test done at initial orientation visit.  The plan for exercise progression was also introduced and progression will be customized based on patient's performance and goals. 30 Day review completed. Medical Director ITP review done, changes made as directed, and signed approval by Medical Director.    new to program    Row Name 12/17/22 1021 01/14/23 1435 01/28/23 1724       ITP Comments 30 Day review completed. Medical Director ITP review done, changes made as directed, and signed approval by Medical Director. 30 day review completed. ITP sent to Dr. Emily Filbert, Medical Director of Cardiac Rehab. Continue with ITP unless changes are made by physician. Dorvin graduated today from  rehab with 36 sessions completed.  Details of the patient's exercise prescription and what He needs to do in order to continue the prescription and progress were discussed with patient.  Patient was given a copy of prescription and goals.  Patient verbalized understanding.  Antwon plans to continue to exercise by using home treadmill and walking outdoors.              Comments: Discharge ITP

## 2023-01-28 NOTE — Progress Notes (Signed)
Discharge Summary   [name]  DOB: 03/25/1961  Antonio [name] graduated today from  rehab with 36 sessions completed.  Details of the patient's exercise prescription and what He needs to do in order to continue the prescription and progress were discussed with patient.  Patient was given a copy of prescription and goals.  Patient verbalized understanding.  Bharath plans to continue to exercise by using home treadmill and walking outdoors.   [name] 10/20/22 1508 01/12/23 1732       6 Minute Walk   Phase Initial Discharge    Distance 1320 feet 1805 feet    Distance % Change -- 36.7 %    Distance Feet Change -- 485 ft    Walk Time 6 minutes 6 minutes    # of Rest Breaks 0 0    MPH 2.5 3.42    METS 3.85 4.63    RPE 11 12    Perceived Dyspnea  0 0    VO2 Peak 13.49 16.22    Symptoms No No    Resting HR 78 bpm 71 bpm    Resting BP 136/86 118/60    Resting Oxygen Saturation  98 % 99 %    Exercise Oxygen Saturation  during 6 min walk 100 % 96 %    Max Ex. HR 104 bpm 109 bpm    Max Ex. BP 138/70 136/62    2 Minute Post BP 120/80 --

## 2023-04-06 ENCOUNTER — Ambulatory Visit: Payer: Managed Care, Other (non HMO) | Admitting: Podiatry

## 2023-04-06 DIAGNOSIS — B353 Tinea pedis: Secondary | ICD-10-CM

## 2023-04-06 DIAGNOSIS — M722 Plantar fascial fibromatosis: Secondary | ICD-10-CM

## 2023-04-06 MED ORDER — KETOCONAZOLE 2 % EX CREA: 1.0000 | TOPICAL_CREAM | Freq: Every day | CUTANEOUS | 2 refills | Status: AC

## 2023-04-06 NOTE — Progress Notes (Signed)
  Subjective:  Patient ID: Antonio [name], male    DOB: [birth year],  MRN: [id number]  Chief Complaint  Patient presents with   Neuroma     right foot has knots on bottom   Tinea Pedis    left foot 4th and 5th toe cracking in between    62 y.o. male presents with the above complaint. History confirmed with patient.  Returns for follow-up the right foot fibromas are becoming more painful.  He also has a new issue of dry cracking skin on the left foot between the fourth and fifth toes, became red inflamed and had some drainage a couple weeks ago has been putting Neosporin on it and cleaning it  Objective:  Physical Exam: warm, good capillary refill, no trophic changes or ulcerative lesions, normal DP and PT pulses, normal sensory exam, and right foot painful plantar fibroma medial band, 2 lesions, nontender left foot medial band fibromas, left foot fourth interspace dry cracking peeling skin  Assessment:   1. Tinea pedis of left foot   2. Plantar fibromatosis       Plan:  Patient was evaluated and treated and all questions answered.  We again discussed etiology and treatment options of plantar fibromatosis including injection therapy and surgical excision if this is not improving.  He was interested in injection therapy today.  Following sterile prep with alcohol, each lesion was injected with 0.25 cc of 0.5% Marcaine plain and 10 mg Kenalog.  He tolerated as well   Discussed the etiology and treatment options for tinea pedis.  Discussed topical and oral treatment.  Recommended topical treatment with 2% ketoconazole cream.  This was sent to the patient's pharmacy.  Also discussed appropriate foot hygiene  Return if symptoms worsen or fail to improve.

## 2023-04-10 ENCOUNTER — Other Ambulatory Visit: Payer: Self-pay | Admitting: *Deleted

## 2023-04-10 DIAGNOSIS — C61 Malignant neoplasm of prostate: Secondary | ICD-10-CM

## 2023-04-13 ENCOUNTER — Telehealth: Payer: Self-pay | Admitting: *Deleted

## 2023-04-13 NOTE — Telephone Encounter (Signed)
Patient called asking if he can come pickup a [facility] order for his lab that is scheduled for 5/23 since he works for [facility] and can get is done free there. Please call him when ready for pickup

## 2023-04-16 ENCOUNTER — Inpatient Hospital Stay: Payer: Managed Care, Other (non HMO) | Attending: Radiation Oncology

## 2023-04-21 ENCOUNTER — Encounter: Payer: Self-pay | Admitting: Radiation Oncology

## 2023-04-23 ENCOUNTER — Ambulatory Visit
Admission: RE | Admit: 2023-04-23 | Discharge: 2023-04-23 | Disposition: A | Discharge status: Home / Self Care | Payer: Managed Care, Other (non HMO) | Source: Ambulatory Visit | Attending: Radiation Oncology | Admitting: Radiation Oncology

## 2023-04-23 VITALS — BP 111/75 | HR 69 | Temp 98.0°F | Resp 14 | Ht 70.0 in | Wt 175.5 lb

## 2023-04-23 DIAGNOSIS — C61 Malignant neoplasm of prostate: Secondary | ICD-10-CM

## 2023-04-23 NOTE — Progress Notes (Signed)
Radiation Oncology Follow up Note  Name: Antonio [name]   Date:   04/23/2023 MRN:  [id number] DOB: May 02, 1961    This 62 y.o. male presents to the clinic today for 3-year follow-up status post I-125 interstitial implant for Gleason 7 adenocarcinoma the prostate presenting with a PSA of 6.5.  REFERRING PROVIDER: No ref. provider found  HPI: Patient is a 62 year old male now out 3 years having completed I-125 interstitial implant for Gleason 7 adenocarcinoma the prostate.  Seen today he does have some nocturia although he is discontinued Flomax.  Specifically denies any bowel problems fatigue.  His most recent PSA is 0.3  COMPLICATIONS OF TREATMENT: none  FOLLOW UP COMPLIANCE: keeps appointments   PHYSICAL EXAM:  BP 111/75   Pulse 69   Temp 98 F (36.7 C)   Resp 14   Ht 5\' 10"  (1.778 m)   Wt 175 lb 8 oz (79.6 kg)   BMI 25.18 kg/m  Well-developed well-nourished patient in NAD. HEENT reveals PERLA, EOMI, discs not visualized.  Oral cavity is clear. No oral mucosal lesions are identified. Neck is clear without evidence of cervical or supraclavicular adenopathy. Lungs are clear to A&P. Cardiac examination is essentially unremarkable with regular rate and rhythm without murmur rub or thrill. Abdomen is benign with no organomegaly or masses noted. Motor sensory and DTR levels are equal and symmetric in the upper and lower extremities. Cranial nerves II through XII are grossly intact. Proprioception is intact. No peripheral adenopathy or edema is identified. No motor or sensory levels are noted. Crude visual fields are within normal range.  RADIOLOGY RESULTS: No current films for review  PLAN: At this time I believe be fine for the patient to be followed by his PMD with yearly PSAs.  Be happy to reevaluate the patient anytime should that be indicated.  Patient is to call with any concerns.   I would like to take this opportunity to thank you for allowing me to participate in the care of your  patient.Carmina Miller, MD

## 2024-04-21 ENCOUNTER — Ambulatory Visit: Admitting: Urology

## 2024-04-21 VITALS — BP 135/79 | HR 59 | Ht 69.0 in | Wt 183.0 lb

## 2024-04-21 DIAGNOSIS — R3915 Urgency of urination: Secondary | ICD-10-CM

## 2024-04-21 DIAGNOSIS — N529 Male erectile dysfunction, unspecified: Secondary | ICD-10-CM | POA: Diagnosis not present

## 2024-04-21 DIAGNOSIS — C61 Malignant neoplasm of prostate: Secondary | ICD-10-CM | POA: Diagnosis not present

## 2024-04-21 LAB — URINALYSIS, COMPLETE
Bilirubin, UA: NEGATIVE
Glucose, UA: NEGATIVE
Ketones, UA: NEGATIVE
Leukocytes,UA: NEGATIVE
Nitrite, UA: NEGATIVE
Protein,UA: NEGATIVE
Specific Gravity, UA: 1.02 (ref 1.005–1.030)
Urobilinogen, Ur: 0.2 mg/dL (ref 0.2–1.0)
pH, UA: 6 (ref 5.0–7.5)

## 2024-04-21 LAB — MICROSCOPIC EXAMINATION

## 2024-04-21 LAB — BLADDER SCAN AMB NON-IMAGING: Scan Result: 1

## 2024-04-21 MED ORDER — TADALAFIL 5 MG PO TABS
5.0000 mg | ORAL_TABLET | Freq: Every day | ORAL | 11 refills | Status: AC | PRN
Start: 2024-04-21 — End: ?

## 2024-04-21 NOTE — Progress Notes (Signed)
   04/21/2024 9:01 AM   Ellsworth Haas 04-Oct-1961 [id number]  Reason for visit: Follow up prostate cancer, ED, urinary symptoms  HPI: 63 year old male who underwent brachytherapy for unfavorable intermediate risk prostate cancer in November 2020.  He had some significant urinary symptoms for a few months afterwards that ultimately resolved.  He had a cystoscopy in June 2021 that was benign, prostate measured 29 g.  Previously he was using Cialis  for ED with good results, as well as Flomax  daily.  I last saw him in October 2022 and recommended 51-month follow-up with PSA, but he has not followed up since that time.  He denies any changes since our last visit.  Really not having any significant urinary symptoms at this time aside from some occasional urgency, no longer taking Flomax .  Urinalysis today benign, PVR today normal at 1 ml.  Using the Cialis  5 mg on demand with good results.  We discussed if his urinary symptoms worsen he could trial the Cialis  daily or every other day.  PSA has been low, most recently 0.3 from May 2024.  Repeat PSA today is pending.  Cialis  refilled Call with PSA results from today RTC 1 year PSA prior, PVR   Lawerence Pressman, MD  [facility] [address], [address] [place], [place] [address] [phone]

## 2024-04-22 ENCOUNTER — Ambulatory Visit: Payer: Self-pay | Admitting: Urology

## 2024-04-22 LAB — PSA: Prostate Specific Ag, Serum: 0.2 ng/mL (ref 0.0–4.0)

## 2024-06-28 ENCOUNTER — Other Ambulatory Visit: Payer: Self-pay

## 2024-06-28 ENCOUNTER — Emergency Department

## 2024-06-28 ENCOUNTER — Emergency Department
Admission: EM | Admit: 2024-06-28 | Discharge: 2024-06-28 | Disposition: A | Discharge status: Home / Self Care | Attending: Emergency Medicine | Admitting: Emergency Medicine

## 2024-06-28 DIAGNOSIS — Z7901 Long term (current) use of anticoagulants: Secondary | ICD-10-CM | POA: Diagnosis not present

## 2024-06-28 DIAGNOSIS — I509 Heart failure, unspecified: Secondary | ICD-10-CM | POA: Insufficient documentation

## 2024-06-28 DIAGNOSIS — R55 Syncope and collapse: Secondary | ICD-10-CM | POA: Insufficient documentation

## 2024-06-28 DIAGNOSIS — G453 Amaurosis fugax: Secondary | ICD-10-CM | POA: Insufficient documentation

## 2024-06-28 DIAGNOSIS — H538 Other visual disturbances: Secondary | ICD-10-CM | POA: Diagnosis present

## 2024-06-28 LAB — CBC WITH DIFFERENTIAL/PLATELET
Abs Immature Granulocytes: 0.02 K/uL (ref 0.00–0.07)
Basophils Absolute: 0.1 K/uL (ref 0.0–0.1)
Basophils Relative: 1 %
Eosinophils Absolute: 0.1 K/uL (ref 0.0–0.5)
Eosinophils Relative: 2 %
HCT: 41.9 % (ref 39.0–52.0)
Hemoglobin: 13.6 g/dL (ref 13.0–17.0)
Immature Granulocytes: 0 %
Lymphocytes Relative: 23 %
Lymphs Abs: 1.4 K/uL (ref 0.7–4.0)
MCH: 30.8 pg (ref 26.0–34.0)
MCHC: 32.5 g/dL (ref 30.0–36.0)
MCV: 94.8 fL (ref 80.0–100.0)
Monocytes Absolute: 0.5 K/uL (ref 0.1–1.0)
Monocytes Relative: 8 %
Neutro Abs: 3.9 K/uL (ref 1.7–7.7)
Neutrophils Relative %: 66 %
Platelets: 186 K/uL (ref 150–400)
RBC: 4.42 MIL/uL (ref 4.22–5.81)
RDW: 13.9 % (ref 11.5–15.5)
WBC: 5.9 K/uL (ref 4.0–10.5)
nRBC: 0 % (ref 0.0–0.2)

## 2024-06-28 LAB — COMPREHENSIVE METABOLIC PANEL WITH GFR
ALT: 14 U/L (ref 0–44)
AST: 28 U/L (ref 15–41)
Albumin: 3.9 g/dL (ref 3.5–5.0)
Alkaline Phosphatase: 59 U/L (ref 38–126)
Anion gap: 8 (ref 5–15)
BUN: 26 mg/dL — ABNORMAL HIGH (ref 8–23)
CO2: 26 mmol/L (ref 22–32)
Calcium: 9.2 mg/dL (ref 8.9–10.3)
Chloride: 106 mmol/L (ref 98–111)
Creatinine, Ser: 1.08 mg/dL (ref 0.61–1.24)
GFR, Estimated: >60 mL/min (ref >60)
Glucose, Bld: 104 mg/dL — ABNORMAL HIGH (ref 70–99)
Potassium: 3.7 mmol/L (ref 3.5–5.1)
Sodium: 140 mmol/L (ref 135–145)
Total Bilirubin: 1.2 mg/dL (ref 0.0–1.2)
Total Protein: 7.3 g/dL (ref 6.5–8.1)

## 2024-06-28 LAB — TROPONIN I (HIGH SENSITIVITY)
Troponin I (High Sensitivity): 9 ng/L (ref <18)
Troponin I (High Sensitivity): 8 ng/L (ref <18)

## 2024-06-28 LAB — PROTIME-INR
INR: 2.7 — ABNORMAL HIGH (ref 0.8–1.2)
Prothrombin Time: 30 s — ABNORMAL HIGH (ref 11.4–15.2)

## 2024-06-28 LAB — CBG MONITORING, ED: Glucose-Capillary: 94 mg/dL (ref 70–99)

## 2024-06-28 MED ORDER — IOHEXOL 350 MG/ML SOLN
75.0000 mL | Freq: Once | INTRAVENOUS | Status: AC | PRN
  Administered 2024-06-28: 75 mL via INTRAVENOUS

## 2024-06-28 MED ORDER — IOHEXOL 350 MG/ML SOLN
100.0000 mL | Freq: Once | INTRAVENOUS | Status: AC | PRN
  Administered 2024-06-28: 100 mL via INTRAVENOUS

## 2024-06-28 NOTE — ED Notes (Addendum)
 Labs sent down with Pt labels. EDP notfied of R. Sided cheek sensory loss, Pt state it was minor.

## 2024-06-28 NOTE — ED Provider Notes (Signed)
 [facility] Provider Note    Event Date/Time   First MD Initiated Contact with Patient 06/28/24 [phone]     (approximate)   History   Weakness   HPI  Antonio [name] is a 63 year old male with history of CHF, aortic valve replacement on warfarin, thoracic aortic aneurysm repair presenting to the emergency department for evaluation following a syncopal episode with resolved vision loss.  Patient reports that when he woke up this morning he felt lightheaded.  Felt fine when he went to bed last night.  When he woke up he noticed that he began to have visual changes with loss of vision over his right eye.  He estimates that for about 5 minutes he had total vision loss of his right eye.  Was able to fully see out of his left eye without difficulty, but no visual perception out of his right eye when his left eye was covered.  Now feels as though his vision has fully recovered.  No associated chest pain, shortness of breath.  No numbness, tingling, focal weakness.  Reports a history of TIAs in the past, but denies similar visual loss in the past.   Physical Exam   Triage Vital Signs: ED Triage Vitals  Encounter Vitals Group     BP 06/28/24 0933 139/79     Girls Systolic BP Percentile --      Girls Diastolic BP Percentile --      Boys Systolic BP Percentile --      Boys Diastolic BP Percentile --      Pulse Rate 06/28/24 0933 66     Resp --      Temp 06/28/24 0935 97.7 F (36.5 C)     Temp Source 06/28/24 0935 Oral     SpO2 06/28/24 0933 100 %     Weight 06/28/24 0935 170 lb (77.1 kg)     Height 06/28/24 0935 5' 9 (1.753 m)     Head Circumference --      Peak Flow --      Pain Score 06/28/24 0935 0     Pain Loc --      Pain Education --      Exclude from Growth Chart --     Most recent vital signs: Vitals:   06/28/24 1412 06/28/24 1430  BP:  121/72  Pulse:  (!) 54  Resp:  17  Temp: 97.8 F (36.6 C)   SpO2:  99%     General: Awake, interactive   HEENT: Pupils equal and reactive, not injected CV:  Regular rate, good peripheral perfusion.  Resp:  Unlabored respirations.  Abd:  Nondistended.  Neuro:  Keenly aware, correctly answers month and age, able to blink eyes and squeeze hands, normal extraocular movements, no visual field loss, normal facial symmetry, no arm or leg motor drift, no limb ataxia, normal sensation, no aphasia, no dysarthria, no inattention. NIH 0    ED Results / Procedures / Treatments   Labs (all labs ordered are listed, but only abnormal results are displayed) Labs Reviewed  COMPREHENSIVE METABOLIC PANEL WITH GFR - Abnormal; Notable for the following components:      Result Value   Glucose, Bld 104 (*)    BUN 26 (*)    All other components within normal limits  PROTIME-INR - Abnormal; Notable for the following components:   Prothrombin Time 30.0 (*)    INR 2.7 (*)    All other components within normal limits  CBC WITH  DIFFERENTIAL/PLATELET  CBG MONITORING, ED  TROPONIN I (HIGH SENSITIVITY)  TROPONIN I (HIGH SENSITIVITY)     EKG EKG independently reviewed and interpreted by myself demonstrates:  EKG demonstrate sinus rhythm at a rate of 60, PR 215, QRS 166, QTc 466, nonspecific ST changes, bifascicular block  RADIOLOGY Imaging independently reviewed and interpreted by myself demonstrates:  CTA head and neck without acute findings of the head and neck, but radiologist did note calcific plaque in the visualized descending thoracic aorta for which they did recommend a dedicated CT angiogram of the chest  Formal Radiology Read:  CT Angio Chest/Abd/Pel for Dissection W and/or Wo Contrast Result Date: 06/28/2024 CLINICAL DATA:  Status post prior aortic valve repair.  Syncope. EXAM: CT ANGIOGRAPHY CHEST, ABDOMEN AND PELVIS TECHNIQUE: Non-contrast CT of the chest was initially obtained. Multidetector CT imaging through the chest, abdomen and pelvis was performed using the standard protocol during bolus  administration of intravenous contrast. Multiplanar reconstructed images and MIPs were obtained and reviewed to evaluate the vascular anatomy. RADIATION DOSE REDUCTION: This exam was performed according to the departmental dose-optimization program which includes automated exposure control, adjustment of the mA and/or kV according to patient size and/or use of iterative reconstruction technique. CONTRAST:  OMNIPAQUE  IOHEXOL  350 MG/ML SOLN COMPARISON:  CT abdomen pelvis dated 08/26/2019 and chest CT dated 09/11/2005. FINDINGS: CTA CHEST FINDINGS Cardiovascular: There is no cardiomegaly or pericardial effusion. There is coronary vascular calcification. Mechanical aortic valve. There is postsurgical changes of the ascending aorta aneurysm repair. No aortic dissection. No periaortic fluid collection. The origins of the great vessels of the aortic arch and the central pulmonary arteries are patent. Mediastinum/Nodes: No hilar or mediastinal adenopathy. The esophagus is grossly unremarkable. No mediastinal fluid collection. Lungs/Pleura: The lungs are clear. There is no pleural effusion or pneumothorax. The central airways are patent. Musculoskeletal: No acute osseous pathology. Median sternotomy wires. Review of the MIP images confirms the above findings. CTA ABDOMEN AND PELVIS FINDINGS VASCULAR Aorta: Mild atherosclerotic calcification of the abdominal aorta. No aneurysmal dilatation or dissection. No periaortic fluid collection. Celiac: Patent without evidence of aneurysm, dissection, vasculitis or significant stenosis. SMA: Patent without evidence of aneurysm, dissection, vasculitis or significant stenosis. Renals: Both renal arteries are patent without evidence of aneurysm, dissection, vasculitis, fibromuscular dysplasia or significant stenosis. IMA: Patent without evidence of aneurysm, dissection, vasculitis or significant stenosis. Inflow: Mild atherosclerotic calcification. No aneurysmal dilatation or  dissection. The iliac arteries are patent. Veins: No obvious venous abnormality within the limitations of this arterial phase study. Review of the MIP images confirms the above findings. NON-VASCULAR No intra-abdominal free air or free fluid. Hepatobiliary: No focal liver abnormality is seen. No gallstones, gallbladder wall thickening, or biliary dilatation. Pancreas: Unremarkable. No pancreatic ductal dilatation or surrounding inflammatory changes. Spleen: Normal in size without focal abnormality. Adrenals/Urinary Tract: The adrenal glands are unremarkable. There is no hydronephrosis on either side. There is symmetric enhancement and excretion of contrast by both kidneys. The visualized ureters appear unremarkable. There is mild trabeculated appearance of the bladder wall likely sequela of chronic bladder outlet obstruction. Stomach/Bowel: There is mild diffuse thickened appearance of the colon likely related to underdistention. There is no bowel obstruction. Appendectomy. Lymphatic: No adenopathy. Reproductive: Prostate brachytherapy seeds. Other: None Musculoskeletal: No acute or significant osseous findings. Review of the MIP images confirms the above findings. IMPRESSION: 1. No acute intrathoracic, abdominal, or pelvic pathology. 2. Postsurgical changes of the ascending aorta aneurysm repair and aortic valve. No aortic dissection. 3.  Aortic Atherosclerosis (ICD10-I70.0). Electronically Signed   By: Vanetta Chou M.D.   On: 06/28/2024 14:26   CT ANGIO HEAD NECK W WO CM Result Date: 06/28/2024 EXAM: CT HEAD WITHOUT CTA HEAD AND NECK WITH AND WITHOUT 06/28/2024 11:20:28 AM TECHNIQUE: CTA of the head and neck was performed with and without the administration of intravenous contrast. Noncontrast CT of the head with reconstructed 2-D images are also provided for review. Multiplanar 2D and/or 3D reformatted images are provided for review. Automated exposure control, iterative reconstruction, and/or weight based  adjustment of the mA/kV was utilized to reduce the radiation dose to as low as reasonably achievable. COMPARISON: None available CLINICAL HISTORY: Vision loss, monocular; resolved R eye vision loss. Pt arrives via EMS from home d/t not feeling right; Pt c/o syncope episode, unwitnessed, Pt was found by wife on floor that was carpeted; Denies LOC, hitting head; Pt took (3) 81 mg ASA; EMS assessment: HA on R. side of head, losing vision in R. eye, tingling R. side of face. equal grip strength, no facial droop; EMS vitals: CBG 86, 12 Lead: NSR, w/ multifocal PVCs BP 152/76, HR 68, O2: 100% RA; EMS interventions:#18 RAC; This RN assessment: Denies chest pain, Extensive cardiac Hx; Hx: Aortic valve replacement 3 years, AFIB, TIA stroke; Meds: 8 mg Warfarin, ASA; LKW: When he woke up around 0700 this AM he felt fine, then he was dizzy and doesn't remember what happened, wife found him around 0800. FINDINGS: CT HEAD: BRAIN AND VENTRICLES: No acute intracranial hemorrhage. No mass effect. No midline shift. No extra-axial fluid collection. Gray-white differentiation is maintained. No hydrocephalus. ORBITS: No acute abnormality. SINUSES AND MASTOIDS: No acute abnormality. CTA NECK: AORTIC ARCH AND ARCH VESSELS: There is calcific and noncalcific plaque present within the visualized ascending thoracic aortic arch. The patient is status post sternotomy. There is 3-vessel takeoff of the great arteries. A dedicated CT angiogram of the chest is recommended to better assess the ascending limb of the thoracic aorta. CERVICAL CAROTID ARTERIES: The common carotid and internal carotid arteries are normal in caliber and unremarkable throughout the neck. No dissection, arterial injury, or hemodynamically significant stenosis by NASCET criteria. CERVICAL VERTEBRAL ARTERIES: The vertebral arteries are widely patent. No dissection, arterial injury, or significant stenosis. LUNGS AND MEDIASTINUM: Unremarkable. SOFT TISSUES: No acute  abnormality. BONES: No acute abnormality. CTA HEAD: ANTERIOR CIRCULATION: The cranial and cavernous segments of the internal carotid arteries are widely patent. The ophthalmic arteries are also patent bilaterally. No significant stenosis of the internal carotid arteries. No significant stenosis of the anterior cerebral arteries. No significant stenosis of the middle cerebral arteries. No aneurysm. POSTERIOR CIRCULATION: No significant stenosis of the posterior cerebral arteries. No significant stenosis of the basilar artery. No significant stenosis of the vertebral arteries. No aneurysm. OTHER: No dural venous sinus thrombosis on this non-dedicated study. IMPRESSION: 1. No acute findings. 2. Calcific and noncalcific plaque in the visualized ascending thoracic aorta and aortic arch. Status post sternotomy. 3-vessel takeoff of the great arteries. 3. Dedicated CT angiogram of the chest is recommended to better assess the ascending limb of the thoracic aorta. Electronically signed by: evalene coho 06/28/2024 12:08 PM EDT RP Workstation: [id number]    PROCEDURES:  Critical Care performed: No  Procedures   MEDICATIONS ORDERED IN ED: Medications  iohexol  (OMNIPAQUE ) 350 MG/ML injection 75 mL (75 mLs Intravenous Contrast Given 06/28/24 1111)  iohexol  (OMNIPAQUE ) 350 MG/ML injection 100 mL (100 mLs Intravenous Contrast Given 06/28/24 1244)     IMPRESSION /  MDM / ASSESSMENT AND PLAN / ED COURSE  I reviewed the triage vital signs and the nursing notes.  Differential diagnosis includes, but is not limited to, amaurosis fugax, TIA, lower suspicion CVA given resolved symptoms, arrhythmia, anemia, electrolyte abnormality  Patient's presentation is most consistent with acute presentation with potential threat to life or bodily function.  63 year old male presenting to the emergency department for evaluation following a resolved episode of painless monocular vision loss.  Stable vitals on presentation.   Reassuring CBC, CMP.  INR elevated at 2.7 on warfarin, within patient's goal.  Negative initial troponin.  CTA head and neck did not demonstrate any acute intracranial or neck findings, but the radiologist did note plaques within the ascending aorta.  Patient does have a history of a thoracic aortic aneurysm s/p repair, but will obtain dedicated CTA dissection protocol to further evaluate for acute abnormality though patient is denying any chest pain here.  Dedicated CT dissection protocol without acute abnormality.  Patient reassessed.  Remains without recurrent symptoms.  Clinical presentation most consistent with amaurosis fugax.  Did discuss that presentation is similar to TIA.  I did consider and discuss admission, but patient reports he prefers to be discharged home.  ABCD 2 score 1.  I did review the case with Dr. Jaye with ophthalmology who does note that the patient can follow-up with ophthalmology for further evaluation, recommends follow-up in the next 1 to 2 days.  Strict return precautions were provided.  Patient was discharged in stable condition.      FINAL CLINICAL IMPRESSION(S) / ED DIAGNOSES   Final diagnoses:  Amaurosis fugax of right eye  Syncope and collapse     Rx / DC Orders   ED Discharge Orders     None        Note:  This document was prepared using Dragon voice recognition software and may include unintentional dictation errors.   Levander Slate, MD 06/28/24 [phone]

## 2024-06-28 NOTE — Discharge Instructions (Addendum)
 You were seen in the ER today for evaluation of your vision loss and episode of passing out.  Your testing here fortunately did not show an emergency cause for this.  However, it is very important that you follow-up closely as an outpatient for further evaluation.  Please follow-up with your primary care doctor or cardiologist.  Please also arrange follow-up with an ophthalmologist in the next 1 to 2 days.  Return to the ER immediately for any new or worsening symptoms.

## 2024-06-28 NOTE — ED Triage Notes (Addendum)
 Pt arrives via EMS from home d/t not feeling right Pt c/o syncope episode, unwitnessed, Pt was found by wife on floor that was carpeted Denies LOC, hitting head Pt took (3) 81 mg ASA,  EMS assessment: HA on R. side of head, losing vision in R. eye, tingling R. side of face. equal grtip strength, no facial droop  EMS vitals: CBG 86, 12 Lead:  NSR, w/ multifocal PVCs BP 152/76, HR 68, O2: 100% RA  EMS interventions:#18 RAC This RN assessment: Denies chest pain, Extensive cardiac Hx Hx: Aortic valve replacement 3 years, AFIB, TIA stroke  Meds: 8 mg Warfarin, ASA LKW: When he woke up around 0700 this AM he felt fine, then he was dizzy and doesn't remember what happened, wife found him around 0800.

## 2024-06-28 NOTE — ED Notes (Signed)
 Fall precautions in place for Pt. This RN placed fall band, fall grip socks, bed alarm and fall sign.

## 2024-07-27 ENCOUNTER — Encounter: Payer: Self-pay | Admitting: Urology

## 2025-04-14 ENCOUNTER — Other Ambulatory Visit

## 2025-04-19 ENCOUNTER — Ambulatory Visit: Admitting: Urology

## 2025-04-20 ENCOUNTER — Ambulatory Visit: Admitting: Urology
